# Patient Record
Sex: Female | Born: 1935 | Race: White | Hispanic: No | Smoking: Never smoker
Health system: Southern US, Community
[De-identification: ages and names within clinical notes are randomized; demographics above are authoritative.]

## PROBLEM LIST (undated history)

## (undated) DIAGNOSIS — E7800 Pure hypercholesterolemia, unspecified: Secondary | ICD-10-CM

## (undated) DIAGNOSIS — F0390 Unspecified dementia without behavioral disturbance: Secondary | ICD-10-CM

## (undated) DIAGNOSIS — F419 Anxiety disorder, unspecified: Secondary | ICD-10-CM

## (undated) DIAGNOSIS — G309 Alzheimer's disease, unspecified: Secondary | ICD-10-CM

## (undated) DIAGNOSIS — M810 Age-related osteoporosis without current pathological fracture: Secondary | ICD-10-CM

## (undated) DIAGNOSIS — F0280 Dementia in other diseases classified elsewhere without behavioral disturbance: Secondary | ICD-10-CM

## (undated) HISTORY — DX: Unspecified dementia, unspecified severity, without behavioral disturbance, psychotic disturbance, mood disturbance, and anxiety: F03.90

## (undated) HISTORY — DX: Anxiety disorder, unspecified: F41.9

## (undated) HISTORY — DX: Alzheimer's disease, unspecified: G30.9

## (undated) HISTORY — DX: Pure hypercholesterolemia, unspecified: E78.00

## (undated) HISTORY — DX: Dementia in other diseases classified elsewhere, unspecified severity, without behavioral disturbance, psychotic disturbance, mood disturbance, and anxiety: F02.80

---

## 2005-11-23 ENCOUNTER — Ambulatory Visit: Payer: Self-pay | Admitting: Urology

## 2007-08-21 ENCOUNTER — Ambulatory Visit: Payer: Self-pay

## 2010-04-07 ENCOUNTER — Ambulatory Visit: Payer: Self-pay | Admitting: Endocrinology

## 2011-11-29 ENCOUNTER — Emergency Department: Payer: Self-pay | Admitting: Emergency Medicine

## 2013-08-12 ENCOUNTER — Encounter: Payer: Self-pay | Admitting: Podiatry

## 2013-08-12 ENCOUNTER — Ambulatory Visit (INDEPENDENT_AMBULATORY_CARE_PROVIDER_SITE_OTHER): Payer: Medicare HMO | Admitting: Podiatry

## 2013-08-12 VITALS — BP 113/68 | HR 73 | Resp 16 | Ht 60.0 in | Wt 155.0 lb

## 2013-08-12 DIAGNOSIS — M79609 Pain in unspecified limb: Secondary | ICD-10-CM

## 2013-08-12 DIAGNOSIS — B351 Tinea unguium: Secondary | ICD-10-CM

## 2013-08-12 NOTE — Progress Notes (Signed)
Cynthia Robles presents today with her assistant complaining of painful nails bilateral.  Objective: Vital signs are stable she is alert but not oriented. Her Alzheimer's appears to be getting worse. Nails are painful on palpation as well as debridement. Pulses are intact bilateral.  Assessment: Worsening of her Alzheimer's disease with pain in limb secondary to onychomycosis.  Plan: Debridement of nails in thickness and length as a covered service secondary to pain. Followup with her in 3 months.

## 2013-11-11 ENCOUNTER — Ambulatory Visit: Payer: Self-pay | Admitting: Podiatry

## 2013-11-14 ENCOUNTER — Ambulatory Visit: Payer: Self-pay | Admitting: Podiatry

## 2013-11-25 ENCOUNTER — Ambulatory Visit: Payer: Self-pay | Admitting: Podiatry

## 2014-01-13 ENCOUNTER — Ambulatory Visit (INDEPENDENT_AMBULATORY_CARE_PROVIDER_SITE_OTHER): Payer: Medicare HMO | Admitting: Podiatry

## 2014-01-13 VITALS — BP 129/80 | HR 60 | Resp 20

## 2014-01-13 DIAGNOSIS — B351 Tinea unguium: Secondary | ICD-10-CM

## 2014-01-13 DIAGNOSIS — M79609 Pain in unspecified limb: Secondary | ICD-10-CM

## 2014-01-13 NOTE — Progress Notes (Signed)
She presents today for chief complaint of painful elongated toenails.  Objective: Vital signs are stable she is alert and oriented x3. Nails are thick yellow dystrophic with mycotic and painful palpation as well as debridement.  Assessment: Pain in limb secondary to onychomycosis 1 through 5 bilateral.  Plan: Debridement of nails 1 through 5 bilateral covered service.

## 2014-04-14 ENCOUNTER — Ambulatory Visit (INDEPENDENT_AMBULATORY_CARE_PROVIDER_SITE_OTHER): Payer: Medicare HMO | Admitting: Podiatry

## 2014-04-14 ENCOUNTER — Encounter: Payer: Self-pay | Admitting: Podiatry

## 2014-04-14 VITALS — BP 117/72 | HR 79 | Resp 12

## 2014-04-14 DIAGNOSIS — M79609 Pain in unspecified limb: Secondary | ICD-10-CM

## 2014-04-14 DIAGNOSIS — B351 Tinea unguium: Secondary | ICD-10-CM

## 2014-04-14 DIAGNOSIS — M79676 Pain in unspecified toe(s): Secondary | ICD-10-CM

## 2014-04-14 NOTE — Progress Notes (Signed)
She presents today with her assistant chief complaint of painful E. elongated toenails bilateral.  Objective: Pulses are palpable bilateral. Nails are thick yellow dystrophic onychomycotic and painful palpation.  Assessment: Pain in limb secondary to onychomycosis 1 through 5 bilateral.  Plan: Debridement of nails 1 through 5 bilateral covered service secondary to pain.

## 2014-07-21 ENCOUNTER — Ambulatory Visit (INDEPENDENT_AMBULATORY_CARE_PROVIDER_SITE_OTHER): Payer: Medicare HMO | Admitting: Podiatry

## 2014-07-21 DIAGNOSIS — M79676 Pain in unspecified toe(s): Secondary | ICD-10-CM

## 2014-07-21 DIAGNOSIS — B351 Tinea unguium: Secondary | ICD-10-CM

## 2014-07-21 NOTE — Progress Notes (Signed)
Presents today chief complaint of painful elongated toenails.  Objective: Pulses are palpable bilateral nails are thick, yellow dystrophic onychomycosis and painful palpation.   Assessment: Onychomycosis with pain in limb.  Plan: Treatment of nails in thickness and length as covered service secondary to pain.  

## 2014-10-20 ENCOUNTER — Ambulatory Visit (INDEPENDENT_AMBULATORY_CARE_PROVIDER_SITE_OTHER): Payer: Medicare HMO | Admitting: Podiatry

## 2014-10-20 ENCOUNTER — Ambulatory Visit: Payer: Medicare HMO | Admitting: Podiatry

## 2014-10-20 DIAGNOSIS — M79676 Pain in unspecified toe(s): Secondary | ICD-10-CM

## 2014-10-20 DIAGNOSIS — B351 Tinea unguium: Secondary | ICD-10-CM

## 2014-10-20 NOTE — Progress Notes (Signed)
She presents today with her assistant chief complaint of painful E. elongated toenails bilateral.  Objective: Pulses are palpable bilateral. Nails are thick yellow dystrophic onychomycotic and painful palpation.  Assessment: Pain in limb secondary to onychomycosis 1 through 5 bilateral.  Plan: Debridement of nails 1 through 5 bilateral covered service secondary to pain.

## 2015-01-19 ENCOUNTER — Encounter: Payer: Self-pay | Admitting: Podiatry

## 2015-01-19 ENCOUNTER — Ambulatory Visit (INDEPENDENT_AMBULATORY_CARE_PROVIDER_SITE_OTHER): Payer: Medicare PPO | Admitting: Podiatry

## 2015-01-19 ENCOUNTER — Ambulatory Visit: Payer: Medicare HMO

## 2015-01-19 DIAGNOSIS — M79676 Pain in unspecified toe(s): Secondary | ICD-10-CM | POA: Diagnosis not present

## 2015-01-19 DIAGNOSIS — B351 Tinea unguium: Secondary | ICD-10-CM

## 2015-01-19 NOTE — Progress Notes (Signed)
She presents today with her assistant chief complaint of painful E. elongated toenails bilateral.  Objective: Pulses are palpable bilateral. Nails are thick yellow dystrophic onychomycotic and painful palpation.  Assessment: Pain in limb secondary to onychomycosis 1 through 5 bilateral.  Plan: Debridement of nails 1 through 5 bilateral covered service secondary to pain.

## 2015-05-01 ENCOUNTER — Emergency Department: Payer: Medicare PPO

## 2015-05-01 ENCOUNTER — Encounter: Payer: Self-pay | Admitting: Emergency Medicine

## 2015-05-01 ENCOUNTER — Inpatient Hospital Stay
Admission: EM | Admit: 2015-05-01 | Discharge: 2015-05-05 | DRG: 872 | Disposition: A | Discharge status: Home / Self Care | Payer: Medicare PPO | Attending: Internal Medicine | Admitting: Internal Medicine

## 2015-05-01 DIAGNOSIS — A419 Sepsis, unspecified organism: Secondary | ICD-10-CM | POA: Diagnosis not present

## 2015-05-01 DIAGNOSIS — L8990 Pressure ulcer of unspecified site, unspecified stage: Secondary | ICD-10-CM | POA: Insufficient documentation

## 2015-05-01 DIAGNOSIS — F0280 Dementia in other diseases classified elsewhere without behavioral disturbance: Secondary | ICD-10-CM | POA: Diagnosis present

## 2015-05-01 DIAGNOSIS — Z881 Allergy status to other antibiotic agents status: Secondary | ICD-10-CM

## 2015-05-01 DIAGNOSIS — E860 Dehydration: Secondary | ICD-10-CM | POA: Diagnosis present

## 2015-05-01 DIAGNOSIS — R4182 Altered mental status, unspecified: Secondary | ICD-10-CM

## 2015-05-01 DIAGNOSIS — B961 Klebsiella pneumoniae [K. pneumoniae] as the cause of diseases classified elsewhere: Secondary | ICD-10-CM | POA: Diagnosis present

## 2015-05-01 DIAGNOSIS — Z66 Do not resuscitate: Secondary | ICD-10-CM | POA: Diagnosis present

## 2015-05-01 DIAGNOSIS — F419 Anxiety disorder, unspecified: Secondary | ICD-10-CM | POA: Diagnosis present

## 2015-05-01 DIAGNOSIS — Z7982 Long term (current) use of aspirin: Secondary | ICD-10-CM | POA: Diagnosis not present

## 2015-05-01 DIAGNOSIS — E785 Hyperlipidemia, unspecified: Secondary | ICD-10-CM | POA: Diagnosis present

## 2015-05-01 DIAGNOSIS — R509 Fever, unspecified: Secondary | ICD-10-CM

## 2015-05-01 DIAGNOSIS — Z79899 Other long term (current) drug therapy: Secondary | ICD-10-CM | POA: Diagnosis not present

## 2015-05-01 DIAGNOSIS — N390 Urinary tract infection, site not specified: Secondary | ICD-10-CM

## 2015-05-01 DIAGNOSIS — D649 Anemia, unspecified: Secondary | ICD-10-CM | POA: Diagnosis present

## 2015-05-01 DIAGNOSIS — G309 Alzheimer's disease, unspecified: Secondary | ICD-10-CM | POA: Diagnosis present

## 2015-05-01 HISTORY — DX: Age-related osteoporosis without current pathological fracture: M81.0

## 2015-05-01 LAB — APTT: aPTT: 30 seconds (ref 24–36)

## 2015-05-01 LAB — CBC WITH DIFFERENTIAL/PLATELET
BASOS ABS: 0 10*3/uL (ref 0–0.1)
Basophils Relative: 0 %
EOS PCT: 6 %
Eosinophils Absolute: 0.9 10*3/uL — ABNORMAL HIGH (ref 0–0.7)
HCT: 33.2 % — ABNORMAL LOW (ref 35.0–47.0)
Hemoglobin: 10.7 g/dL — ABNORMAL LOW (ref 12.0–16.0)
Lymphocytes Relative: 7 %
Lymphs Abs: 1.1 10*3/uL (ref 1.0–3.6)
MCH: 27.2 pg (ref 26.0–34.0)
MCHC: 32.2 g/dL (ref 32.0–36.0)
MCV: 84.7 fL (ref 80.0–100.0)
Monocytes Absolute: 0.8 10*3/uL (ref 0.2–0.9)
Monocytes Relative: 5 %
Neutro Abs: 12.5 10*3/uL — ABNORMAL HIGH (ref 1.4–6.5)
Neutrophils Relative %: 82 %
Platelets: 386 10*3/uL (ref 150–440)
RBC: 3.91 MIL/uL (ref 3.80–5.20)
RDW: 16.1 % — ABNORMAL HIGH (ref 11.5–14.5)
WBC: 15.4 10*3/uL — ABNORMAL HIGH (ref 3.6–11.0)

## 2015-05-01 LAB — URINALYSIS COMPLETE WITH MICROSCOPIC (ARMC ONLY)
BILIRUBIN URINE: NEGATIVE
Glucose, UA: NEGATIVE mg/dL
KETONES UR: NEGATIVE mg/dL
Nitrite: POSITIVE — AB
PROTEIN: 30 mg/dL — AB
SPECIFIC GRAVITY, URINE: 1.02 (ref 1.005–1.030)
pH: 7 (ref 5.0–8.0)

## 2015-05-01 LAB — COMPREHENSIVE METABOLIC PANEL
ALBUMIN: 3.6 g/dL (ref 3.5–5.0)
ALT: 17 U/L (ref 14–54)
ANION GAP: 12 (ref 5–15)
AST: 27 U/L (ref 15–41)
Alkaline Phosphatase: 59 U/L (ref 38–126)
BILIRUBIN TOTAL: 0.5 mg/dL (ref 0.3–1.2)
BUN: 26 mg/dL — AB (ref 6–20)
CO2: 25 mmol/L (ref 22–32)
Calcium: 9 mg/dL (ref 8.9–10.3)
Chloride: 97 mmol/L — ABNORMAL LOW (ref 101–111)
Creatinine, Ser: 1.21 mg/dL — ABNORMAL HIGH (ref 0.44–1.00)
GFR calc Af Amer: 47 mL/min — ABNORMAL LOW (ref >60)
GFR calc non Af Amer: 41 mL/min — ABNORMAL LOW (ref >60)
GLUCOSE: 124 mg/dL — AB (ref 65–99)
POTASSIUM: 3.4 mmol/L — AB (ref 3.5–5.1)
Sodium: 134 mmol/L — ABNORMAL LOW (ref 135–145)
Total Protein: 7.1 g/dL (ref 6.5–8.1)

## 2015-05-01 LAB — PROTIME-INR
INR: 0.97
PROTHROMBIN TIME: 13.1 s (ref 11.4–15.0)

## 2015-05-01 LAB — BRAIN NATRIURETIC PEPTIDE: B NATRIURETIC PEPTIDE 5: 123 pg/mL — AB (ref 0.0–100.0)

## 2015-05-01 LAB — LACTIC ACID, PLASMA
Lactic Acid, Venous: 2.7 mmol/L (ref 0.5–2.0)
Lactic Acid, Venous: 1.7 mmol/L (ref 0.5–2.0)

## 2015-05-01 LAB — TROPONIN I: Troponin I: <0.03 ng/mL (ref <0.031)

## 2015-05-01 MED ORDER — PIPERACILLIN-TAZOBACTAM 3.375 G IVPB 30 MIN
3.3750 g | Freq: Once | INTRAVENOUS | Status: AC
  Administered 2015-05-01: 3.375 g via INTRAVENOUS
  Filled 2015-05-01: qty 50

## 2015-05-01 MED ORDER — ONDANSETRON HCL 4 MG/2ML IJ SOLN: 4.0000 mg | Freq: Four times a day (QID) | INTRAMUSCULAR | Status: DC | PRN

## 2015-05-01 MED ORDER — FLUOXETINE HCL 10 MG PO CAPS
10.0000 mg | ORAL_CAPSULE | Freq: Two times a day (BID) | ORAL | Status: DC
  Administered 2015-05-02 – 2015-05-05 (×7): 10 mg via ORAL
  Filled 2015-05-01 (×11): qty 1

## 2015-05-01 MED ORDER — DONEPEZIL HCL 5 MG PO TABS
10.0000 mg | ORAL_TABLET | Freq: Every day | ORAL | Status: DC
  Administered 2015-05-02 – 2015-05-05 (×4): 10 mg via ORAL
  Filled 2015-05-01 (×4): qty 2

## 2015-05-01 MED ORDER — PIPERACILLIN-TAZOBACTAM 3.375 G IVPB
3.3750 g | Freq: Three times a day (TID) | INTRAVENOUS | Status: DC
  Administered 2015-05-02 – 2015-05-05 (×11): 3.375 g via INTRAVENOUS
  Filled 2015-05-01 (×15): qty 50

## 2015-05-01 MED ORDER — ALPRAZOLAM 0.25 MG PO TABS
0.2500 mg | ORAL_TABLET | Freq: Two times a day (BID) | ORAL | Status: DC
  Administered 2015-05-02 – 2015-05-05 (×7): 0.25 mg via ORAL
  Filled 2015-05-01 (×7): qty 1

## 2015-05-01 MED ORDER — VANCOMYCIN HCL IN DEXTROSE 1-5 GM/200ML-% IV SOLN
1000.0000 mg | Freq: Once | INTRAVENOUS | Status: AC
  Administered 2015-05-01: 1000 mg via INTRAVENOUS
  Filled 2015-05-01: qty 200

## 2015-05-01 MED ORDER — ACETAMINOPHEN 500 MG PO TABS
500.0000 mg | ORAL_TABLET | Freq: Two times a day (BID) | ORAL | Status: DC
  Administered 2015-05-02 – 2015-05-05 (×8): 500 mg via ORAL
  Filled 2015-05-01 (×9): qty 1

## 2015-05-01 MED ORDER — ASPIRIN EC 81 MG PO TBEC
81.0000 mg | DELAYED_RELEASE_TABLET | Freq: Every day | ORAL | Status: DC
  Administered 2015-05-02 – 2015-05-05 (×4): 81 mg via ORAL
  Filled 2015-05-01 (×4): qty 1

## 2015-05-01 MED ORDER — SODIUM CHLORIDE 0.9 % IV BOLUS (SEPSIS)
2109.0000 mL | Freq: Once | INTRAVENOUS | Status: AC
  Administered 2015-05-01: 2109 mL via INTRAVENOUS

## 2015-05-01 MED ORDER — CALCIUM CARBONATE-VITAMIN D 500-200 MG-UNIT PO TABS
1.0000 | ORAL_TABLET | Freq: Every day | ORAL | Status: DC
  Administered 2015-05-02 – 2015-05-05 (×4): 1 via ORAL
  Filled 2015-05-01 (×4): qty 1

## 2015-05-01 MED ORDER — SODIUM CHLORIDE 0.9 % IV SOLN
INTRAVENOUS | Status: DC
  Administered 2015-05-01: 1000 mL via INTRAVENOUS
  Administered 2015-05-02 (×2): via INTRAVENOUS

## 2015-05-01 MED ORDER — ACETAMINOPHEN 650 MG RE SUPP
650.0000 mg | Freq: Once | RECTAL | Status: AC
  Administered 2015-05-01: 650 mg via RECTAL
  Filled 2015-05-01: qty 1

## 2015-05-01 NOTE — ED Provider Notes (Signed)
[facility] Emergency Department Provider Note  ____________________________________________  Time seen: Approximately 6:05 PM  I have reviewed the triage vital signs and the nursing notes.   HISTORY  Chief Complaint Altered Mental Status  Caveat-history of present illness and review of systems Limited secondary to the patient's dementia and altered mental status/severity of illness. All history of present illness and review of systems obtained from EMS on arrival.  HPI Cynthia Robles is a 81 y.o. female history of Alzheimer's disease, hyperlipidemia who presents for evaluation of decreased level of consciousness/altered mental status, gradual onset today, constant. Current severity 9 out of 10. She was recently treated for urinary tract infection. Her caregivers called EMS and on their arrival she was noted to be febrile but remaining vital signs within normal limits. No other history available.   Past Medical History  Diagnosis Date  . Anxiety   . Alzheimer disease   . Dementia   . High cholesterol     There are no active problems to display for this patient.   History reviewed. No pertinent past surgical history.  Current Outpatient Rx  Name  Route  Sig  Dispense  Refill  . acetaminophen (TYLENOL) 500 MG tablet   Oral   Take 500 mg by mouth 2 (two) times daily.          Marland Kitchen ALPRAZolam (XANAX) 0.25 MG tablet   Oral   Take 0.25 mg by mouth 2 (two) times daily.         Marland Kitchen aspirin EC 81 MG tablet   Oral   Take 81 mg by mouth daily.         . Calcium Carbonate-Vitamin D (CALCIUM-D) 600-400 MG-UNIT TABS   Oral   Take 1 tablet by mouth daily.         Marland Kitchen denosumab (PROLIA) 60 MG/ML SOLN injection   Subcutaneous   Inject 60 mg into the skin every 6 (six) months. Administer in upper arm, thigh, or abdomen         . diphenhydrAMINE (BENADRYL) 25 mg capsule   Oral   Take 25 mg by mouth 2 (two) times daily.         Marland Kitchen donepezil  (ARICEPT) 10 MG tablet   Oral   Take 10 mg by mouth daily.         Marland Kitchen FLUoxetine (PROZAC) 10 MG capsule   Oral   Take 10 mg by mouth 2 (two) times daily.         . furosemide (LASIX) 20 MG tablet   Oral   Take 20 mg by mouth daily.         . Multiple Vitamins-Minerals (MULTIVITAMIN PO)   Oral   Take 1 tablet by mouth daily.           Allergies Flagyl and Levaquin  History reviewed. No pertinent family history.  Social History History  Substance Use Topics  . Smoking status: Never Smoker   . Smokeless tobacco: Never Used  . Alcohol Use: No    Review of Systems  Constitutional: + fever   Caveat-history of present illness and review of systems Limited secondary to the patient's dementia and altered mental status/severity of illness. All history of present illness and review of systems obtained from EMS on arrival. ____________________________________________   PHYSICAL EXAM:  Filed Vitals:   05/01/15 1809 05/01/15 1817 05/01/15 1900 05/01/15 2000  BP: 123/63  113/63 126/83  Pulse: 81  82 71  Temp: 100.6 F (  38.1 C) 101.3 F (38.5 C) 101.2 F (38.4 C) 100.8 F (38.2 C)  TempSrc: Oral Rectal    Resp: Height:  (1.626 m)     Weight: 130 lb (58.968 kg)     SpO2: 93%  97% 95%      Constitutional: Awake but appears sleepy and confused, answers "what" to all questions, does not follow commands. Appears diaphoretic. Eyes: Conjunctivae are normal. PERRL. EOMI. Head: Atraumatic. Nose: No congestion/rhinnorhea. Mouth/Throat: Mucous membranes are moist.  Oropharynx non-erythematous. Neck: No stridor.   Cardiovascular: Normal rate, regular rhythm. Grossly normal heart sounds.  Good peripheral circulation. Respiratory: + tachypnea No retractions. Lungs CTAB. Gastrointestinal: Soft and nontender. No distention. No abdominal bruits. No CVA tenderness. Genitourinary: deferred Musculoskeletal: No lower extremity tenderness nor edema.  No joint  effusions. Neurologic: Patient appears to move all extremities equally and all extremities to noxious stimuli. GCS 12. Skin:  Skin is warm and intact. No rash noted. Multiple areas of ecchymosis in various stages of healing throughout all extremities. Psychiatric: unable to assess  ____________________________________________   LABS (all labs ordered are listed, but only abnormal results are displayed)  Labs Reviewed  COMPREHENSIVE METABOLIC PANEL - Abnormal; Notable for the following:    Sodium 134 (*)    Potassium 3.4 (*)    Chloride 97 (*)    Glucose, Bld 124 (*)    BUN 26 (*)    Creatinine, Ser 1.21 (*)    GFR calc non Af Amer 41 (*)    GFR calc Af Amer 47 (*)    All other components within normal limits  CBC WITH DIFFERENTIAL/PLATELET - Abnormal; Notable for the following:    WBC 15.4 (*)    Hemoglobin 10.7 (*)    HCT 33.2 (*)    RDW 16.1 (*)    Neutro Abs 12.5 (*)    Eosinophils Absolute 0.9 (*)    All other components within normal limits  LACTIC ACID, PLASMA - Abnormal; Notable for the following:    Lactic Acid, Venous 2.7 (*)    All other components within normal limits  BRAIN NATRIURETIC PEPTIDE - Abnormal; Notable for the following:    B Natriuretic Peptide 123.0 (*)    All other components within normal limits  URINALYSIS COMPLETEWITH MICROSCOPIC ([facility] ONLY) - Abnormal; Notable for the following:    Color, Urine AMBER (*)    APPearance CLOUDY (*)    Hgb urine dipstick 1+ (*)    Protein, ur 30 (*)    Nitrite POSITIVE (*)    Leukocytes, UA 2+ (*)    Bacteria, UA MANY (*)    Squamous Epithelial / LPF 6-30 (*)    All other components within normal limits  CULTURE, BLOOD (ROUTINE X 2)  CULTURE, BLOOD (ROUTINE X 2)  URINE CULTURE  TROPONIN I  APTT  PROTIME-INR  LACTIC ACID, PLASMA   ____________________________________________  EKG  ED ECG REPORT I, Gayla Doss, the attending physician, personally viewed and interpreted this ECG.   Date:  05/01/2015  EKG Time: 18:38  Rate: 82  Rhythm: Suspect normal sinus rhythm  Axis: normal  Intervals:none  ST&T Change: No acute ST segment elevation, nonspecific ST and T-wave abnormality. Suspect normal sinus rhythm however motion artifact limits interpretation.  ____________________________________________  RADIOLOGY  CXR FINDINGS: There is a shallow inspiration. There is mild chronic appearing interstitial coarsening. No confluent airspace opacity is evident. No effusion is evident. Heart size is normal.  IMPRESSION: Chronic appearing interstitial  coarsening. No acute cardiopulmonary findings.   CT head IMPRESSION: No evidence of acute intracranial abnormality.  Atrophy, chronic small-vessel white matter ischemic changes and remote right cerebellar infarct.IMPRESSION: No evidence of acute intracranial abnormality.  Atrophy, chronic small-vessel white matter ischemic changes and remote right cerebellar infarct. ____________________________________________   PROCEDURES  Procedure(s) performed: None  Critical Care performed: Yes, see critical care note(s). Total critical care time spent 35 minutes.  ____________________________________________   INITIAL IMPRESSION / ASSESSMENT AND PLAN / ED COURSE  Pertinent labs & imaging results that were available during my care of the patient were reviewed by me and considered in my medical decision making (see chart for details).  Cynthia Robles is a 81 y.o. female history of Alzheimer's disease, hyperlipidemia who presents for evaluation of decreased level of consciousness/altered mental status today. On exam she appears to be altered but is moving all extremities equally though she is not able to cooperate formally with full neurological testing. She is febrile with temperature 101.3, tachypnea meeting 2 out of 4 Sirs criteria. Plan for labs, cultures, 30 mL/kg bolus of normal saline as well as Vancomycin and Zosyn  empirically. We will obtain labs, chest x-ray, urinalysis, CT head and anticipate admission.  ----------------------------------------- 8:24 PM on 05/01/2015 ----------------------------------------- Labs notable for leukocytosis of 15,000, anemia hemoglobin 10.7. Creatinine mildly elevated at 1.27. Elevated lactic acid. Imaging negative for any acute cardiopulmonary or intracranial abnormality. Urinalysis consistent with nitrite positive UTI. Discussed with hospitalist for admission for treatment of UTI sepsis. Vitals stable.   ____________________________________________   FINAL CLINICAL IMPRESSION(S) / ED DIAGNOSES  Final diagnoses:  Altered mental status, unspecified altered mental status type  Fever and chills  UTI (lower urinary tract infection)  Sepsis, due to unspecified organism      Gayla Doss, MD 05/01/15 2026

## 2015-05-01 NOTE — Plan of Care (Signed)
Problem: Discharge Progression Outcomes Goal: Discharge plan in place and appropriate Individualization Pt admitted from ED with UTI not responding to home antibiotics Hx of Dementia controlled by home meds 24 hour care givers in the home

## 2015-05-01 NOTE — ED Notes (Signed)
Pt via ems from home with altered mental status, emesis, fever. Pt just finished round of antibiotics for uti

## 2015-05-01 NOTE — Progress Notes (Signed)
ANTIBIOTIC CONSULT NOTE - INITIAL  Pharmacy Consult for Zosyn Indication: Sepsis  Allergies  Allergen Reactions  . Flagyl [Metronidazole] Other (See Comments)    Reaction:  Unknown   . Levaquin [Levofloxacin In D5w] Hives    Patient Measurements: Height:  (162.6 cm) Weight: 130 lb (58.968 kg) IBW/kg (Calculated) : 54.7  Vital Signs: Temp: 100.9 F (38.3 C) (07/29 2125) Temp Source: Other (Comment) (07/29 2101) BP: 125/65 mmHg (07/29 2125) Pulse Rate: 73 (07/29 2125) Intake/Output from previous day:   Intake/Output from this shift: Total I/O In: -  Out: 175 [Urine:175]  Labs:  Recent Labs  05/01/15 1820  WBC 15.4*  HGB 10.7*  PLT 386  CREATININE 1.21*   Estimated Creatinine Clearance: 31.5 mL/min (by C-G formula based on Cr of 1.21). No results for input(s): VANCOTROUGH, VANCOPEAK, VANCORANDOM, GENTTROUGH, GENTPEAK, GENTRANDOM, TOBRATROUGH, TOBRAPEAK, TOBRARND, AMIKACINPEAK, AMIKACINTROU, AMIKACIN in the last 72 hours.   Microbiology: No results found for this or any previous visit (from the past 720 hour(s)).  Medical History: Past Medical History  Diagnosis Date  . Anxiety   . Alzheimer disease   . Dementia   . High cholesterol     Medications:   (Not in a hospital admission) Scheduled:   Infusions:  . sodium chloride 1,000 mL (05/01/15 2126)  . [START ON 05/02/2015] piperacillin-tazobactam (ZOSYN)  IV     PRN:   Assessment: 81 y/o F failing outpatient abx for UTI admitted with sepsis.   Goal  of Therapy:  Resolution of infection.   Plan:  Zosyn 3.375 g iv once given in ED. Will order Zosyn 3.375 g EI q 8 h beginning 6 hours after initial dose.   Luisa Hart D 05/01/2015,9:49 PM

## 2015-05-01 NOTE — H&P (Signed)
Cynthia Robles is an 81 y.o. female.   Chief Complaint: Decrease in alertness HPI: Pt diagnosed with UTI 2 weeks ago and treated with abx. Symptoms improved. Today had decreased alertness and vomited a couple of times. Found to still have UTI in ED.  Past Medical History  Diagnosis Date  . Anxiety   . Alzheimer disease   . Dementia   . High cholesterol     History reviewed. No pertinent past surgical history.  Thyroid nodule removed  History reviewed. No pertinent family history.  FH of CAD Social History:  reports that she has never smoked. She has never used smokeless tobacco. She reports that she does not drink alcohol or use illicit drugs.  Allergies:  Allergies  Allergen Reactions  . Flagyl [Metronidazole] Other (See Comments)    Reaction:  Unknown   . Levaquin [Levofloxacin In D5w] Hives     (Not in a hospital admission)  Results for orders placed or performed during the hospital encounter of 05/01/15 (from the past 48 hour(s))  Comprehensive metabolic panel     Status: Abnormal   Collection Time: 05/01/15  6:20 PM  Result Value Ref Range   Sodium 134 (L) 135 - 145 mmol/L   Potassium 3.4 (L) 3.5 - 5.1 mmol/L   Chloride 97 (L) 101 - 111 mmol/L   CO2 25 22 - 32 mmol/L   Glucose, Bld 124 (H) 65 - 99 mg/dL   BUN 26 (H) 6 - 20 mg/dL   Creatinine, Ser 1.21 (H) 0.44 - 1.00 mg/dL   Calcium 9.0 8.9 - 10.3 mg/dL   Total Protein 7.1 6.5 - 8.1 g/dL   Albumin 3.6 3.5 - 5.0 g/dL   AST 27 15 - 41 U/L   ALT 17 14 - 54 U/L   Alkaline Phosphatase 59 38 - 126 U/L   Total Bilirubin 0.5 0.3 - 1.2 mg/dL   GFR calc non Af Amer 41 (L) >60 mL/min   GFR calc Af Amer 47 (L) >60 mL/min    Comment: (NOTE) The eGFR has been calculated using the CKD EPI equation. This calculation has not been validated in all clinical situations. eGFR's persistently <60 mL/min signify possible Chronic Kidney Disease.    Anion gap 12 5 - 15  CBC WITH DIFFERENTIAL     Status: Abnormal   Collection  Time: 05/01/15  6:20 PM  Result Value Ref Range   WBC 15.4 (H) 3.6 - 11.0 K/uL   RBC 3.91 3.80 - 5.20 MIL/uL   Hemoglobin 10.7 (L) 12.0 - 16.0 g/dL   HCT 33.2 (L) 35.0 - 47.0 %   MCV 84.7 80.0 - 100.0 fL   MCH 27.2 26.0 - 34.0 pg   MCHC 32.2 32.0 - 36.0 g/dL   RDW 16.1 (H) 11.5 - 14.5 %   Platelets 386 150 - 440 K/uL   Neutrophils Relative % 82 %   Neutro Abs 12.5 (H) 1.4 - 6.5 K/uL   Lymphocytes Relative 7 %   Lymphs Abs 1.1 1.0 - 3.6 K/uL   Monocytes Relative 5 %   Monocytes Absolute 0.8 0.2 - 0.9 K/uL   Eosinophils Relative 6 %   Eosinophils Absolute 0.9 (H) 0 - 0.7 K/uL   Basophils Relative 0 %   Basophils Absolute 0.0 0 - 0.1 K/uL  Lactic acid, plasma     Status: Abnormal   Collection Time: 05/01/15  6:20 PM  Result Value Ref Range   Lactic Acid, Venous 2.7 (HH) 0.5 - 2.0 mmol/L  Comment: RESULTS VERIFIED BY REPEAT TESTING CRITICAL RESULT CALLED TO, READ BACK BY AND VERIFIED WITH BUTCH WOODS AT 1914 05/01/15.PMH   Brain natriuretic peptide     Status: Abnormal   Collection Time: 05/01/15  6:20 PM  Result Value Ref Range   B Natriuretic Peptide 123.0 (H) 0.0 - 100.0 pg/mL  Troponin I     Status: None   Collection Time: 05/01/15  6:20 PM  Result Value Ref Range   Troponin I <0.03 <0.031 ng/mL    Comment:        NO INDICATION OF MYOCARDIAL INJURY.   APTT     Status: None   Collection Time: 05/01/15  6:20 PM  Result Value Ref Range   aPTT 30 24 - 36 seconds  Protime-INR     Status: None   Collection Time: 05/01/15  6:20 PM  Result Value Ref Range   Prothrombin Time 13.1 11.4 - 15.0 seconds   INR 0.97   Urinalysis complete, with microscopic ([facility] only)     Status: Abnormal   Collection Time: 05/01/15  6:20 PM  Result Value Ref Range   Color, Urine AMBER (A) YELLOW   APPearance CLOUDY (A) CLEAR   Glucose, UA NEGATIVE NEGATIVE mg/dL   Bilirubin Urine NEGATIVE NEGATIVE   Ketones, ur NEGATIVE NEGATIVE mg/dL   Specific Gravity, Urine 1.020 1.005 - 1.030   Hgb  urine dipstick 1+ (A) NEGATIVE   pH 7.0 5.0 - 8.0   Protein, ur 30 (A) NEGATIVE mg/dL   Nitrite POSITIVE (A) NEGATIVE   Leukocytes, UA 2+ (A) NEGATIVE   RBC / HPF 6-30 0 - 5 RBC/hpf   WBC, UA TOO NUMEROUS TO COUNT 0 - 5 WBC/hpf   Bacteria, UA MANY (A) NONE SEEN   Squamous Epithelial / LPF 6-30 (A) NONE SEEN   Mucous PRESENT    Ct Head Wo Contrast  05/01/2015   CLINICAL DATA:  81 year old female with altered mental status and fever.  EXAM: CT HEAD WITHOUT CONTRAST  TECHNIQUE: Contiguous axial images were obtained from the base of the skull through the vertex without intravenous contrast.  COMPARISON:  11/29/2011 CT  FINDINGS: Moderate atrophy and chronic small-vessel white matter ischemic changes again noted.  A remote right cerebellar infarct is present.  No acute intracranial abnormalities are identified, including mass lesion or mass effect, hydrocephalus, extra-axial fluid collection, midline shift, hemorrhage, or acute infarction.  The visualized bony calvarium is unremarkable.  IMPRESSION: No evidence of acute intracranial abnormality.  Atrophy, chronic small-vessel white matter ischemic changes and remote right cerebellar infarct.   Electronically Signed   By: Margarette Canada M.D.   On: 05/01/2015 19:46   Dg Chest Port 1 View  05/01/2015   CLINICAL DATA:  Altered mental status.  Emesis.  Fever  EXAM: PORTABLE CHEST - 1 VIEW  COMPARISON:  None.  FINDINGS: There is a shallow inspiration. There is mild chronic appearing interstitial coarsening. No confluent airspace opacity is evident. No effusion is evident. Heart size is normal.  IMPRESSION: Chronic appearing interstitial coarsening. No acute cardiopulmonary findings.   Electronically Signed   By: Andreas Newport M.D.   On: 05/01/2015 19:03    Review of Systems  Constitutional: Positive for fever.  HENT: Negative for hearing loss.   Eyes: Negative for blurred vision.  Respiratory: Negative for cough and shortness of breath.    Cardiovascular: Positive for chest pain.  Gastrointestinal: Positive for nausea and vomiting.  Genitourinary: Positive for dysuria.  Musculoskeletal: Negative for back pain.  Skin: Negative for rash.  Neurological: Negative for tremors.    Blood pressure 90/71, pulse 73, temperature 100.8 F (38.2 C), temperature source Other (Comment), resp. rate 19, height 5' 4"  (1.626 m), weight 58.968 kg (130 lb), SpO2 97 %. Physical Exam  Constitutional:  Ill appearing female   HENT:  Head: Normocephalic and atraumatic.  Mouth/Throat: No oropharyngeal exudate.  Dry oropharnxy  Eyes: Pupils are equal, round, and reactive to light. Left eye exhibits no discharge. No scleral icterus.  Neck: No JVD present. No thyromegaly present.  Cardiovascular: Normal rate.   Murmur heard. Respiratory: No respiratory distress. She has no wheezes. She exhibits no tenderness.  GI: Bowel sounds are normal. She exhibits no distension and no mass.  Musculoskeletal: She exhibits no edema.  Neurological:  Obtunded  Skin: Skin is warm.  Multiple echymosis onupper and lower ext     Assessment/Plan 1. Sepsis: Pt is tachycardic, high WBC and febrile. Will give supportive care and treat underlying cause.   2. UTI: Given Vanc and Zosyn in ED. Will continue Zosyn. Get urine culture. Suspect UTI was resistant to abx given as outpt.  3. N/V: Antiemetics PRN  4. Dehydration: IVF  5. Code Status: Discussed with daughter. She wishes her to be full code.  Time spent= 34mn  JBaxter Hire7/29/2016, 9:05 PM

## 2015-05-02 LAB — CBC
HCT: 28.4 % — ABNORMAL LOW (ref 35.0–47.0)
HEMOGLOBIN: 9.1 g/dL — AB (ref 12.0–16.0)
MCH: 27.1 pg (ref 26.0–34.0)
MCHC: 32 g/dL (ref 32.0–36.0)
MCV: 84.8 fL (ref 80.0–100.0)
Platelets: 289 10*3/uL (ref 150–440)
RBC: 3.34 MIL/uL — AB (ref 3.80–5.20)
RDW: 16 % — ABNORMAL HIGH (ref 11.5–14.5)
WBC: 14.3 10*3/uL — ABNORMAL HIGH (ref 3.6–11.0)

## 2015-05-02 LAB — BASIC METABOLIC PANEL
Anion gap: 6 (ref 5–15)
BUN: 21 mg/dL — AB (ref 6–20)
CO2: 23 mmol/L (ref 22–32)
Calcium: 7.7 mg/dL — ABNORMAL LOW (ref 8.9–10.3)
Chloride: 108 mmol/L (ref 101–111)
Creatinine, Ser: 1.02 mg/dL — ABNORMAL HIGH (ref 0.44–1.00)
GFR calc non Af Amer: 50 mL/min — ABNORMAL LOW (ref >60)
GFR, EST AFRICAN AMERICAN: 58 mL/min — AB (ref >60)
GLUCOSE: 110 mg/dL — AB (ref 65–99)
Potassium: 3.5 mmol/L (ref 3.5–5.1)
Sodium: 137 mmol/L (ref 135–145)

## 2015-05-02 NOTE — Progress Notes (Signed)
Patient shivering while taking b/p  Will recheck. patient has dementia and is frustrated with auto b/p check

## 2015-05-02 NOTE — Progress Notes (Signed)
Sitter states she doesn't need her feet elevated because she moves her feet a lot during the night

## 2015-05-02 NOTE — Progress Notes (Signed)
[facility] Phys[facility] - [place] at [facility]   PATIENT NAME: Cynthia Robles    MR#:  [id number]  DATE OF BIRTH:  [birth year]  SUBJECTIVE:  Patient is soundly sleeping. Family is at bedside who provides information. No acute events overnight.  REVIEW OF SYSTEMS:    Review of Systems  Unable to perform ROS: dementia    Tolerating Diet: Yes      DRUG ALLERGIES:   Allergies  Allergen Reactions  . Flagyl [Metronidazole] Other (See Comments)    Reaction:  Unknown   . Levaquin [Levofloxacin In D5w] Hives    VITALS:  Blood pressure 197/161, pulse 68, temperature 97.7 F (36.5 C), temperature source Oral, resp. rate 22, height 5\' 2"  (1.575 m), weight 56.065 kg (123 lb 9.6 oz), SpO2 96 %.  PHYSICAL EXAMINATION:   Physical Exam  Constitutional: She is well-developed, well-nourished, and in no distress. No distress.  HENT:  Head: Normocephalic.  Eyes: No scleral icterus.  Neck: Normal range of motion. No JVD present. No tracheal deviation present.  Cardiovascular: Normal rate, regular rhythm and normal heart sounds.  Exam reveals no gallop and no friction rub.   No murmur heard. Pulmonary/Chest: Effort normal and breath sounds normal. No respiratory distress. She has no wheezes. She has no rales. She exhibits no tenderness.  Abdominal: Soft. Bowel sounds are normal. She exhibits no distension and no mass. There is no tenderness. There is no rebound and no guarding.  Musculoskeletal: Normal range of motion. She exhibits no edema.  Neurological:  sleeping  Skin: Skin is warm. No rash noted. No erythema.      LABORATORY PANEL:   CBC  Recent Labs Lab 05/02/15 0449  WBC 14.3*  HGB 9.1*  HCT 28.4*  PLT 289   ------------------------------------------------------------------------------------------------------------------  Chemistries   Recent Labs Lab 05/01/15 1820 05/02/15 0449  NA 134* 137  K 3.4* 3.5  CL 97* 108  CO2 25 23  GLUCOSE 124*  110*  BUN 26* 21*  CREATININE 1.21* 1.02*  CALCIUM 9.0 7.7*  AST 27  --   ALT 17  --   ALKPHOS 59  --   BILITOT 0.5  --    ------------------------------------------------------------------------------------------------------------------  Cardiac Enzymes  Recent Labs Lab 05/01/15 1820  TROPONINI <0.03   ------------------------------------------------------------------------------------------------------------------  RADIOLOGY:  Ct Head Wo Contrast  05/01/2015   CLINICAL DATA:  81 year old female with altered mental status and fever.  EXAM: CT HEAD WITHOUT CONTRAST  TECHNIQUE: Contiguous axial images were obtained from the base of the skull through the vertex without intravenous contrast.  COMPARISON:  11/29/2011 CT  FINDINGS: Moderate atrophy and chronic small-vessel white matter ischemic changes again noted.  A remote right cerebellar infarct is present.  No acute intracranial abnormalities are identified, including mass lesion or mass effect, hydrocephalus, extra-axial fluid collection, midline shift, hemorrhage, or acute infarction.  The visualized bony calvarium is unremarkable.  IMPRESSION: No evidence of acute intracranial abnormality.  Atrophy, chronic small-vessel white matter ischemic changes and remote right cerebellar infarct.   Electronically Signed   By: Harmon Pier M.D.   On: 05/01/2015 19:46   Dg Chest Port 1 View  05/01/2015   CLINICAL DATA:  Altered mental status.  Emesis.  Fever  EXAM: PORTABLE CHEST - 1 VIEW  COMPARISON:  None.  FINDINGS: There is a shallow inspiration. There is mild chronic appearing interstitial coarsening. No confluent airspace opacity is evident. No effusion is evident. Heart size is normal.  IMPRESSION: Chronic appearing interstitial coarsening. No acute cardiopulmonary  findings.   Electronically Signed   By: Ellery Plunk M.D.   On: 05/01/2015 19:03     ASSESSMENT AND PLAN:   81 year old female with dementia who was treated as an  outpatient for urinary tract infection presented with decreased alertness and vomiting. She is found to have  1. Sepsis: Patient presented with tachycardia, leukocytosis and fever. Sepsis secondary to urinary tract infection. Sepsis improving 2. Urinary tract infection: Blood and urine cultures are pending at this time. Patient will continue on Zosyn. Patient failed outpatient therapy.  3. Nausea/vomiting: This is likely secondary to problem #2. She seems to have tolerated her diet. We'll continue supportive care  4. Dehydration: Continue IV fluids. Her creatinine has improved.  5. Dementia: Patient will continue her outpatient medications which include Aricept and Prozac.      Management plans discussed with the patient's family and they are in agreement.  CODE STATUS: DNR  TOTAL TIME TAKING CARE OF THIS PATIENT: 35 minutes.     POSSIBLE D/C 1-2 days , DEPENDING ON CLINICAL CONDITION.   Cimberly Stoffel M.D on 05/02/2015 at 11:16 AM  Between 7am to 6pm - Pager - [phone] After 6pm go to [email or web address] - password [email or web address] [facility]  [facility]  Office  [phone]  CC: Primary care physician; No PCP Per Patient

## 2015-05-02 NOTE — Plan of Care (Signed)
Problem: Phase I Progression Outcomes Goal: Voiding-avoid urinary catheter unless indicated Outcome: Not Applicable Date Met:  04/88/89 Patient has dementia family member and family sitter at bedside

## 2015-05-02 NOTE — Care Management Note (Signed)
Case Management Note  Patient Details  Name: Cynthia Robles MRN: [id number] Date of Birth: [birth year]  Subjective/Objective:    Discussed discharge planning with 81yo Cynthia Robles daughter Cynthia Robles ph: [phone]. Daughter Cynthia Robles wants to be called when Cynthia Gasparro is discharged home. The following information was provided by daughter Cynthia Robles:  Cynthia Zuccaro has a 24/7 live-in caregiver named Cynthia Robles. PCP= Dr Marylouise Stacks, Pharmacy= [facility] on [address] in [place]. Home equipment includes a "transporter chair", a shower chair, a rolling walker, a bedside commode. Discharge plan per daughter is for Cynthia Borgwardt to return home into the care of her 24/7 caregiver and Cynthia Tolle's family members who will assist with her care.                 Action/Plan:   Expected Discharge Date:                  Expected Discharge Plan:     In-House Referral:     Discharge planning Services     Post Acute Care Choice:    Choice offered to:     DME Arranged:    DME Agency:     HH Arranged:    HH Agency:     Status of Service:     Medicare Important Message Given:    Date Medicare IM Given:    Medicare IM give by:    Date Additional Medicare IM Given:    Additional Medicare Important Message give by:     If discussed at Long Length of Stay Meetings, dates discussed:    Additional Comments:  Shariyah Eland A, RN 05/02/2015, 3:07 PM

## 2015-05-03 LAB — URINE CULTURE: Culture: >=100000

## 2015-05-03 LAB — CBC
HCT: 29.5 % — ABNORMAL LOW (ref 35.0–47.0)
HEMOGLOBIN: 9.4 g/dL — AB (ref 12.0–16.0)
MCH: 27.3 pg (ref 26.0–34.0)
MCHC: 31.8 g/dL — ABNORMAL LOW (ref 32.0–36.0)
MCV: 85.9 fL (ref 80.0–100.0)
Platelets: 290 10*3/uL (ref 150–440)
RBC: 3.43 MIL/uL — AB (ref 3.80–5.20)
RDW: 16.5 % — AB (ref 11.5–14.5)
WBC: 13.4 10*3/uL — AB (ref 3.6–11.0)

## 2015-05-03 MED ORDER — VANCOMYCIN HCL 500 MG IV SOLR
500.0000 mg | INTRAVENOUS | Status: DC
  Filled 2015-05-03: qty 500

## 2015-05-03 MED ORDER — VANCOMYCIN HCL IN DEXTROSE 750-5 MG/150ML-% IV SOLN
750.0000 mg | Freq: Once | INTRAVENOUS | Status: AC
Start: 2015-05-03 — End: 2015-05-03
  Administered 2015-05-03: 750 mg via INTRAVENOUS
  Filled 2015-05-03: qty 150

## 2015-05-03 MED ORDER — VANCOMYCIN HCL IN DEXTROSE 750-5 MG/150ML-% IV SOLN
750.0000 mg | INTRAVENOUS | Status: DC
  Filled 2015-05-03 (×2): qty 150

## 2015-05-03 NOTE — Plan of Care (Signed)
Problem: Discharge Progression Outcomes Goal: Other Discharge Outcomes/Goals Outcome: Progressing Plan of care progress to goal for:  1. Pain:          Denies pain. 2. Hemodynamically Stable:         Remains on IVF's.         Remains on IV Antibiotics.         Foley Catheter intact with yellow urine output. 3. Complications:         Low Grade Fever.         Remains on IV Antibiotics. 4. Diet:         Regular Diet.         Tolerating Well.         Appetite Increasing. 5. Activity:         Bedrest.

## 2015-05-03 NOTE — Progress Notes (Signed)
ANTIBIOTIC CONSULT NOTE - INITIAL  Pharmacy Consult for Vancomycin Indication: rule out sepsis  Allergies  Allergen Reactions  . Flagyl [Metronidazole] Other (See Comments)    Reaction:  Unknown   . Levaquin [Levofloxacin In D5w] Hives    Patient Measurements: Height: 5\' 2"  (157.5 cm) Weight: 123 lb 9.6 oz (56.065 kg) IBW/kg (Calculated) : 50.1   Vital Signs: Temp: 98.4 F (36.9 C) (07/31 0520) Temp Source: Oral (07/31 0520) BP: 129/72 mmHg (07/31 0520) Pulse Rate: 62 (07/31 0520) Intake/Output from previous day: 07/30 0701 - 07/31 0700 In: 1876.3 [P.O.:240; I.V.:1200; IV Piggyback:86.3] Out: 300 [Urine:300] Intake/Output from this shift: Total I/O In: 120 [P.O.:120] Out: -   Labs:  Recent Labs  05/01/15 1820 05/02/15 0449 05/03/15 0436  WBC 15.4* 14.3* 13.4*  HGB 10.7* 9.1* 9.4*  PLT 386 289 290  CREATININE 1.21* 1.02*  --    Estimated Creatinine Clearance: 34.2 mL/min (by C-G formula based on Cr of 1.02). No results for input(s): VANCOTROUGH, VANCOPEAK, VANCORANDOM, GENTTROUGH, GENTPEAK, GENTRANDOM, TOBRATROUGH, TOBRAPEAK, TOBRARND, AMIKACINPEAK, AMIKACINTROU, AMIKACIN in the last 72 hours.   Microbiology: Recent Results (from the past 720 hour(s))  Blood Culture (routine x 2)     Status: None (Preliminary result)   Collection Time: 05/01/15  6:20 PM  Result Value Ref Range Status   Specimen Description BLOOD LEFT FATTY CASTS  Final   Special Requests BOTTLES DRAWN AEROBIC AND ANAEROBIC  1CC  Final   Culture  Setup Time   Final    GRAM POSITIVE COCCI IN CHAINS IN BOTH AEROBIC AND ANAEROBIC BOTTLES IDENTIFICATION TO FOLLOW CRITICAL RESULT CALLED TO, READ BACK BY AND VERIFIED WITH: CHRISTA YOUNG ON 05/02/15 AT 1210PM BY JEF CRITICAL VALUE NOTED.  VALUE IS CONSISTENT WITH PREVIOUSLY REPORTED AND CALLED VALUE.    Culture   Final    GRAM POSITIVE COCCI IN CHAINS IDENTIFICATION TO FOLLOW IN BOTH AEROBIC AND ANAEROBIC BOTTLES    Report Status PENDING   Incomplete  Blood Culture (routine x 2)     Status: None (Preliminary result)   Collection Time: 05/01/15  6:20 PM  Result Value Ref Range Status   Specimen Description BLOOD LEFT WRIST  Final   Special Requests BOTTLES DRAWN AEROBIC AND ANAEROBIC  1CC  Final   Culture  Setup Time   Final    GRAM POSITIVE COCCI IN CLUSTERS IN BOTH AEROBIC AND ANAEROBIC BOTTLES IDENTIFICATION TO FOLLOW CRITICAL RESULT CALLED TO, READ BACK BY AND VERIFIED WITH: CHRISTA YOUNG ON 05/02/15 AT 1650 BY JEF    Culture   Final    GRAM POSITIVE COCCI IN CLUSTERS IN BOTH AEROBIC AND ANAEROBIC BOTTLES IDENTIFICATION TO FOLLOW    Report Status PENDING  Incomplete  Urine culture     Status: None   Collection Time: 05/01/15  6:20 PM  Result Value Ref Range Status   Specimen Description URINE, RANDOM  Final   Special Requests NONE  Final   Culture >=100,000 COLONIES/mL KLEBSIELLA OXYTOCA  Final   Report Status 05/03/2015 FINAL  Final   Organism ID, Bacteria KLEBSIELLA OXYTOCA  Final      Susceptibility   Klebsiella oxytoca - MIC*    AMPICILLIN >=32 RESISTANT Resistant     CEFTAZIDIME <=1 SENSITIVE Sensitive     CEFAZOLIN 8 SENSITIVE Sensitive     CEFTRIAXONE <=1 SENSITIVE Sensitive     CIPROFLOXACIN <=0.25 SENSITIVE Sensitive     GENTAMICIN <=1 SENSITIVE Sensitive     IMIPENEM <=0.25 SENSITIVE Sensitive     TRIMETH/SULFA <=  20 SENSITIVE Sensitive     NITROFURANTOIN Value in next row Resistant      RESISTANT256    PIP/TAZO Value in next row Sensitive      SENSITIVE<=4    * >=100,000 COLONIES/mL KLEBSIELLA OXYTOCA    Medical History: Past Medical History  Diagnosis Date  . Anxiety   . Alzheimer disease   . Dementia   . High cholesterol   . Alzheimer disease   . Osteoporosis      Assessment: This 81 year old female patient has been ordered Vancomycin in addition to Zosyn for treatment of sepsis  CrCl ~ 38.31, ke = 0.036, Vd = 39.27, T 1/2 = 19.25  Goal of Therapy:  Vancomycin trough level  15-20 mcg/ml  Plan:  Ordered Vancomycin  IV once at 11:00 today, followed by Vancomycin  IV Q24hr starting at 17:00 today (stacked dose).  Ordered first trough for 8/2 at 16:30 prior to 4th dose.  Pharmacy will continue to follow.  [name],[name] 05/03/2015,11:19 AM

## 2015-05-03 NOTE — Plan of Care (Signed)
Problem: Discharge Progression Outcomes Goal: Other Discharge Outcomes/Goals Outcome: Progressing Plan of care progress to goal for:  1. Pain:          Denies pain. 2. Hemodynamically Stable:         Remains on IVF's.         Remains on IV Antibiotics.         Foley Catheter intact with yellow urine outut. 3. Complications:         Low Grade Fever.         Resolved without Medication after removing blankets. 4. Diet:         Regular Diet.         Tolerating Well.         Decreased Appetite. 5. Activity:         Bedrest.

## 2015-05-03 NOTE — Plan of Care (Signed)
Problem: Discharge Progression Outcomes Goal: Other Discharge Outcomes/Goals Outcome: Progressing Plan of care progress to goal: Discharge plan- pt will discharge to home with 24 hour care givers. Private sitter at bedside currently.  Pain- no c/o pain, no signs or symptoms of pain noted this shift. Hemodynamically stable- BUN, creatinine, and WBC's improving, continue to monitor labs. Complications- low grade temp 100.6 axillary, improved with removing blankets and cooling room, scheduled tylenol given per order.  Diet- tolerating, no c/o nausea or vomiting.  Discharge outcomes- foley cath continues to drain yellow urine.

## 2015-05-03 NOTE — Progress Notes (Addendum)
[facility] Physicians - [place] at [facility]   PATIENT NAME: Cynthia Robles    MR#:  [id number]  DATE OF BIRTH:  03-Sep-1931  SUBJECTIVE:  Blood cx 2/2 +GPC No acute events overnight  REVIEW OF SYSTEMS:    Review of Systems  Unable to perform ROS: dementia    Tolerating Diet: Yes      DRUG ALLERGIES:   Allergies  Allergen Reactions  . Flagyl [Metronidazole] Other (See Comments)    Reaction:  Unknown   . Levaquin [Levofloxacin In D5w] Hives    VITALS:  Blood pressure 129/72, pulse 62, temperature 98.4 F (36.9 C), temperature source Oral, resp. rate 22, height  (1.575 m), weight 56.065 kg (123 lb 9.6 oz), SpO2 98 %.  PHYSICAL EXAMINATION:   Physical Exam  Constitutional: She is well-developed, well-nourished, and in no distress. No distress.  HENT:  Head: Normocephalic.  Eyes: No scleral icterus.  Neck: Normal range of motion. No JVD present. No tracheal deviation present.  Cardiovascular: Normal rate, regular rhythm and normal heart sounds.  Exam reveals no gallop and no friction rub.   No murmur heard. Pulmonary/Chest: Effort normal and breath sounds normal. No respiratory distress. She has no wheezes. She has no rales. She exhibits no tenderness.  Abdominal: Soft. Bowel sounds are normal. She exhibits no distension and no mass. There is no tenderness. There is no rebound and no guarding.  Musculoskeletal: Normal range of motion. She exhibits no edema.  Neurological: She is alert. No cranial nerve deficit.  Skin: Skin is warm. No rash noted. No erythema.  Psychiatric: Affect normal.      LABORATORY PANEL:   CBC  Recent Labs Lab 05/03/15 0436  WBC 13.4*  HGB 9.4*  HCT 29.5*  PLT 290   ------------------------------------------------------------------------------------------------------------------  Chemistries   Recent Labs Lab 05/01/15 1820 05/02/15 0449  NA 134* 137  K 3.4* 3.5  CL 97* 108  CO2 25 23  GLUCOSE 124*  110*  BUN 26* 21*  CREATININE 1.21* 1.02*  CALCIUM 9.0 7.7*  AST 27  --   ALT 17  --   ALKPHOS 59  --   BILITOT 0.5  --    ------------------------------------------------------------------------------------------------------------------  Cardiac Enzymes  Recent Labs Lab 05/01/15 1820  TROPONINI <0.03   ------------------------------------------------------------------------------------------------------------------  RADIOLOGY:  Ct Head Wo Contrast  05/01/2015   CLINICAL DATA:  81 year old female with altered mental status and fever.  EXAM: CT HEAD WITHOUT CONTRAST  TECHNIQUE: Contiguous axial images were obtained from the base of the skull through the vertex without intravenous contrast.  COMPARISON:  11/29/2011 CT  FINDINGS: Moderate atrophy and chronic small-vessel white matter ischemic changes again noted.  A remote right cerebellar infarct is present.  No acute intracranial abnormalities are identified, including mass lesion or mass effect, hydrocephalus, extra-axial fluid collection, midline shift, hemorrhage, or acute infarction.  The visualized bony calvarium is unremarkable.  IMPRESSION: No evidence of acute intracranial abnormality.  Atrophy, chronic small-vessel white matter ischemic changes and remote right cerebellar infarct.   Electronically Signed   By: Harmon Pier M.D.   On: 05/01/2015 19:46   Dg Chest Port 1 View  05/01/2015   CLINICAL DATA:  Altered mental status.  Emesis.  Fever  EXAM: PORTABLE CHEST - 1 VIEW  COMPARISON:  None.  FINDINGS: There is a shallow inspiration. There is mild chronic appearing interstitial coarsening. No confluent airspace opacity is evident. No effusion is evident. Heart size is normal.  IMPRESSION: Chronic appearing interstitial coarsening. No  acute cardiopulmonary findings.   Electronically Signed   By: Ellery Plunk M.D.   On: 05/01/2015 19:03     ASSESSMENT AND PLAN:   81 year old female with dementia who was treated as an  outpatient for urinary tract infection presented with decreased alertness and vomiting. She is found to have  1. Sepsis: Patient presented with tachycardia, leukocytosis and fever. Sepsis secondary to urinary tract infection and now with 2/2 GPC bacteremia.  Continue VANC (pharamacy dosing)  2. Klebsiella Urinary tract infection: This is SENT to Zosyn.Continue ZOSYN.  3. Nausea/vomiting: This is likely secondary to problem #2. She seems to have tolerated her diet. I will continue supportive care  4. Dehydration: Her creatinine has improved. D/c IVF. She is eating  5. Dementia: Patient will continue her outpatient medications which include Aricept and Prozac.  6. GPC bacteremia: Start [facility] and if this is not contaminant will need ECHO and ID consult in am.    Management plans discussed with the patient's family and they are in agreement.  CODE STATUS: DNR  TOTAL TIME TAKING CARE OF THIS PATIENT: 25 minutes.     POSSIBLE D/C 1-2 days back to home with caregivers. , DEPENDING ON CLINICAL CONDITION.   Catricia Scheerer M.D on 05/03/2015 at 10:55 AM  Between 7am to 6pm - Pager - [phone] After 6pm go to [email or web address] - password [email or web address] [facility]  [place] [facility]  Office  [phone]  CC: Primary care physician; No PCP Per Patient

## 2015-05-04 LAB — BASIC METABOLIC PANEL
Anion gap: 6 (ref 5–15)
BUN: 13 mg/dL (ref 6–20)
CO2: 22 mmol/L (ref 22–32)
CREATININE: 0.89 mg/dL (ref 0.44–1.00)
Calcium: 7.9 mg/dL — ABNORMAL LOW (ref 8.9–10.3)
Chloride: 113 mmol/L — ABNORMAL HIGH (ref 101–111)
GFR calc Af Amer: >60 mL/min (ref >60)
GFR calc non Af Amer: 59 mL/min — ABNORMAL LOW (ref >60)
GLUCOSE: 94 mg/dL (ref 65–99)
POTASSIUM: 3.3 mmol/L — AB (ref 3.5–5.1)
SODIUM: 141 mmol/L (ref 135–145)

## 2015-05-04 MED ORDER — VANCOMYCIN HCL IN DEXTROSE 1-5 GM/200ML-% IV SOLN
1000.0000 mg | INTRAVENOUS | Status: DC
  Administered 2015-05-04: 1000 mg via INTRAVENOUS
  Filled 2015-05-04 (×2): qty 200

## 2015-05-04 NOTE — Care Management Important Message (Signed)
Important Message  Patient Details  Name: Cynthia Robles MRN: [id number] Date of Birth: June 11, 1931   Medicare Important Message Given:  Yes-second notification given    [name] 05/04/2015, 11:29 AM

## 2015-05-04 NOTE — Plan of Care (Signed)
Problem: Discharge Progression Outcomes Goal: Other Discharge Outcomes/Goals Outcome: Progressing 1. Pain: No complaints of pain. 2. Hemodynamically Stable: Remains on IV Antibiotics. 3. Complications:  Remains on IV Antibiotics. 4. Diet: Regular Diet. Tolerating Well. 5. Activity: Bedrest.

## 2015-05-04 NOTE — Progress Notes (Signed)
[facility] Physicians - [place] at [facility]   PATIENT NAME: Cynthia Robles    MR#:  [id number]  DATE OF BIRTH:  12-29-1930  SUBJECTIVE:  Intended unable to review of systems but appears comfortable. REVIEW OF SYSTEMS:    Review of Systems  Unable to perform ROS: dementia    Tolerating Diet: Yes      DRUG ALLERGIES:   Allergies  Allergen Reactions  . Flagyl [Metronidazole] Other (See Comments)    Reaction:  Unknown   . Levaquin [Levofloxacin In D5w] Hives    VITALS:  Blood pressure 171/84, pulse 67, temperature 98 F (36.7 C), temperature source Oral, resp. rate 18, height  (1.575 m), weight 56.065 kg (123 lb 9.6 oz), SpO2 96 %.  PHYSICAL EXAMINATION:   Physical Exam  Constitutional: She is well-developed, well-nourished, and in no distress. No distress.  HENT:  Head: Normocephalic.  Eyes: No scleral icterus.  Neck: Normal range of motion. No JVD present. No tracheal deviation present.  Cardiovascular: Normal rate, regular rhythm and normal heart sounds.  Exam reveals no gallop and no friction rub.   No murmur heard. Pulmonary/Chest: Effort normal and breath sounds normal. No respiratory distress. She has no wheezes. She has no rales. She exhibits no tenderness.  Abdominal: Soft. Bowel sounds are normal. She exhibits no distension and no mass. There is no tenderness. There is no rebound and no guarding.  Musculoskeletal: Normal range of motion. She exhibits no edema.  Neurological: She is alert. No cranial nerve deficit.  Skin: Skin is warm. No rash noted. No erythema.  Psychiatric: Affect normal.      LABORATORY PANEL:   CBC  Recent Labs Lab 05/03/15 0436  WBC 13.4*  HGB 9.4*  HCT 29.5*  PLT 290   ------------------------------------------------------------------------------------------------------------------  Chemistries   Recent Labs Lab 05/01/15 1820  05/04/15 0405  NA 134*  < > 141  K 3.4*  < > 3.3*  CL 97*  < >  113*  CO2 25  < > 22  GLUCOSE 124*  < > 94  BUN 26*  < > 13  CREATININE 1.21*  < > 0.89  CALCIUM 9.0  < > 7.9*  AST 27  --   --   ALT 17  --   --   ALKPHOS 59  --   --   BILITOT 0.5  --   --   < > = values in this interval not displayed. ------------------------------------------------------------------------------------------------------------------  Cardiac Enzymes  Recent Labs Lab 05/01/15 1820  TROPONINI <0.03   ------------------------------------------------------------------------------------------------------------------  RADIOLOGY:  No results found.   ASSESSMENT AND PLAN:   81 year old female with dementia who was treated as an outpatient for urinary tract infection presented with decreased alertness and vomiting. She is found to have  1. Sepsis: Patient presented with tachycardia, leukocytosis and fever. Sepsis secondary to urinary tract infection and now with 2/2 GPC bacteremia.  Continue VANC (pharamacy dosing), follow full blood culture results.  2. Klebsiella Urinary tract infection: This is SENT to Zosyn.Continue ZOSYN.  3. Nausea/vomiting: This is likely secondary to problem #2. She seems to have tolerated her diet. I will continue supportive care  4. Dehydration: Her creatinine has improved. D/c IVF. She is eating  5. Dementia: Patient will continue her outpatient medications which include Aricept and Prozac.  6. GPC bacteremia: Complete blood culture report is pending. Patient has no fever but white count is still little bit high. Follow closely Likely discharge tomorrow.  Management plans discussed with  the patient's family and they are in agreement.  CODE STATUS: DNR  TOTAL TIME TAKING CARE OF THIS PATIENT: 25 minutes.     POSSIBLE D/C 1-2 days back to home with caregivers. , DEPENDING ON CLINICAL CONDITION.   Katha Hamming M.D on 05/04/2015 at 10:40 AM  Between 7am to 6pm - Pager - [phone] After 6pm go to [email or web address] -  password [email or web address] [facility]  [place] [facility]  Office  [phone]  CC: Primary care physician; No PCP Per Patient

## 2015-05-04 NOTE — Plan of Care (Signed)
Problem: Discharge Progression Outcomes Goal: Other Discharge Outcomes/Goals 1. Pain:          Denies pain. 2. Hemodynamically Stable:          Remains on IV Antibiotics.         Foley Catheter intact with yellow urine output. 3. Complications:         Remains on IV Antibiotics. 4. Diet:         Regular Diet.         Tolerating Well.         Appetite Increasing. 5. Activity:         Bedrest.

## 2015-05-04 NOTE — Progress Notes (Signed)
ANTIBIOTIC CONSULT NOTE -FOLLOW UP   Pharmacy Consult for Vancomycin Indication: rule out sepsis  Allergies  Allergen Reactions  . Flagyl [Metronidazole] Other (See Comments)    Reaction:  Unknown   . Levaquin [Levofloxacin In D5w] Hives    Patient Measurements: Height:  (157.5 cm) Weight: 123 lb 9.6 oz (56.065 kg) IBW/kg (Calculated) : 50.1   Vital Signs: Temp: 98 F (36.7 C) (08/01 0511) Temp Source: Oral (08/01 0511) BP: 171/84 mmHg (08/01 0511) Pulse Rate: 67 (08/01 0511) Intake/Output from previous day: 07/31 0701 - 08/01 0700 In: 530 [P.O.:480; IV Piggyback:50] Out: 1150 [Urine:1150] Intake/Output from this shift:    Labs:  Recent Labs  05/01/15 1820 05/02/15 0449 05/03/15 0436 05/04/15 0405  WBC 15.4* 14.3* 13.4*  --   HGB 10.7* 9.1* 9.4*  --   PLT 386 289 290  --   CREATININE 1.21* 1.02*  --  0.89   Estimated Creatinine Clearance: 39.2 mL/min (by C-G formula based on Cr of 0.89). No results for input(s): VANCOTROUGH, VANCOPEAK, VANCORANDOM, GENTTROUGH, GENTPEAK, GENTRANDOM, TOBRATROUGH, TOBRAPEAK, TOBRARND, AMIKACINPEAK, AMIKACINTROU, AMIKACIN in the last 72 hours.   Microbiology: Recent Results (from the past 720 hour(s))  Blood Culture (routine x 2)     Status: None (Preliminary result)   Collection Time: 05/01/15  6:20 PM  Result Value Ref Range Status   Specimen Description BLOOD LEFT FATTY CASTS  Final   Special Requests BOTTLES DRAWN AEROBIC AND ANAEROBIC  1CC  Final   Culture  Setup Time   Final    GRAM POSITIVE COCCI IN CHAINS IN BOTH AEROBIC AND ANAEROBIC BOTTLES IDENTIFICATION TO FOLLOW CRITICAL RESULT CALLED TO, READ BACK BY AND VERIFIED WITH: CHRISTA YOUNG ON 05/02/15 AT 1210PM BY JEF CRITICAL VALUE NOTED.  VALUE IS CONSISTENT WITH PREVIOUSLY REPORTED AND CALLED VALUE.    Culture   Final    GRAM POSITIVE COCCI IN CHAINS IDENTIFICATION TO FOLLOW IN BOTH AEROBIC AND ANAEROBIC BOTTLES    Report Status PENDING  Incomplete  Blood  Culture (routine x 2)     Status: None (Preliminary result)   Collection Time: 05/01/15  6:20 PM  Result Value Ref Range Status   Specimen Description BLOOD LEFT WRIST  Final   Special Requests BOTTLES DRAWN AEROBIC AND ANAEROBIC  1CC  Final   Culture  Setup Time   Final    GRAM POSITIVE COCCI IN CLUSTERS IN BOTH AEROBIC AND ANAEROBIC BOTTLES IDENTIFICATION TO FOLLOW CRITICAL RESULT CALLED TO, READ BACK BY AND VERIFIED WITH: CHRISTA YOUNG ON 05/02/15 AT 1650 BY JEF    Culture   Final    GRAM POSITIVE COCCI IN CLUSTERS IN BOTH AEROBIC AND ANAEROBIC BOTTLES IDENTIFICATION TO FOLLOW    Report Status PENDING  Incomplete  Urine culture     Status: None   Collection Time: 05/01/15  6:20 PM  Result Value Ref Range Status   Specimen Description URINE, RANDOM  Final   Special Requests NONE  Final   Culture >=100,000 COLONIES/mL KLEBSIELLA OXYTOCA  Final   Report Status 05/03/2015 FINAL  Final   Organism ID, Bacteria KLEBSIELLA OXYTOCA  Final      Susceptibility   Klebsiella oxytoca - MIC*    AMPICILLIN >=32 RESISTANT Resistant     CEFTAZIDIME <=1 SENSITIVE Sensitive     CEFAZOLIN 8 SENSITIVE Sensitive     CEFTRIAXONE <=1 SENSITIVE Sensitive     CIPROFLOXACIN <=0.25 SENSITIVE Sensitive     GENTAMICIN <=1 SENSITIVE Sensitive     IMIPENEM <=0.25 SENSITIVE  Sensitive     TRIMETH/SULFA <=20 SENSITIVE Sensitive     NITROFURANTOIN Value in next row Resistant      RESISTANT256    PIP/TAZO Value in next row Sensitive      SENSITIVE<=4    * >=100,000 COLONIES/mL KLEBSIELLA OXYTOCA    Medical History: Past Medical History  Diagnosis Date  . Anxiety   . Alzheimer disease   . Dementia   . High cholesterol   . Alzheimer disease   . Osteoporosis      Assessment: This 81 year old female patient has been ordered Vancomycin in addition to Zosyn for treatment of sepsis  CrCl ~ 38.31, ke = 0.036, Vd = 39.27, T 1/2 = 19.25  Updated PK:  Kel (hr-1): 0.038 Half-life (hrs): 18.24 Vd  (liters): 39.27 (factor used: 0.7 L/kg)   Goal of Therapy:  Vancomycin trough level 15-20 mcg/ml  Plan:  Will change to VAncomycin 1 g IV q24 hours and will order Vancomycin trough level prior to the 17:00 dose on 8/4.   Eliezer Khawaja D 05/04/2015,8:35 AM

## 2015-05-05 DIAGNOSIS — L8990 Pressure ulcer of unspecified site, unspecified stage: Secondary | ICD-10-CM | POA: Insufficient documentation

## 2015-05-05 LAB — CBC
HEMATOCRIT: 29.2 % — AB (ref 35.0–47.0)
Hemoglobin: 9.4 g/dL — ABNORMAL LOW (ref 12.0–16.0)
MCH: 27.4 pg (ref 26.0–34.0)
MCHC: 32.4 g/dL (ref 32.0–36.0)
MCV: 84.8 fL (ref 80.0–100.0)
Platelets: 350 10*3/uL (ref 150–440)
RBC: 3.44 MIL/uL — ABNORMAL LOW (ref 3.80–5.20)
RDW: 16.3 % — AB (ref 11.5–14.5)
WBC: 11.7 10*3/uL — ABNORMAL HIGH (ref 3.6–11.0)

## 2015-05-05 LAB — CULTURE, BLOOD (ROUTINE X 2)

## 2015-05-05 LAB — C DIFFICILE QUICK SCREEN W PCR REFLEX
C Diff antigen: NEGATIVE
C Diff interpretation: NEGATIVE
C Diff toxin: NEGATIVE

## 2015-05-05 MED ORDER — CEFDINIR 300 MG PO CAPS: 300.0000 mg | ORAL_CAPSULE | Freq: Two times a day (BID) | ORAL | Status: DC

## 2015-05-05 MED ORDER — ENSURE ENLIVE PO LIQD
237.0000 mL | Freq: Two times a day (BID) | ORAL | Status: DC
  Administered 2015-05-05: 237 mL via ORAL

## 2015-05-05 MED ORDER — CIPROFLOXACIN HCL 500 MG PO TABS: 500.0000 mg | ORAL_TABLET | Freq: Two times a day (BID) | ORAL | Status: DC

## 2015-05-05 NOTE — Plan of Care (Signed)
Problem: Discharge Progression Outcomes Goal: Other Discharge Outcomes/Goals Outcome: Adequate for Discharge Plan of care progress to goal for: UTI Received MD orders to discharge patient to home Continues on oral ABX ] No c/o pain  Patient is incontinent of bowel and bladder. Caregivers at bedside, patient discharge in wheelchair with nursing and caregivers

## 2015-05-05 NOTE — Progress Notes (Signed)
Pt being discharged home, discharge instructions reviewed with daughter, states understanding, pt with no noted complaints at discharge

## 2015-05-05 NOTE — Plan of Care (Signed)
Problem: Discharge Progression Outcomes Goal: Other Discharge Outcomes/Goals Plan of care progress to goal for: UTI - Continues on ABX. - No complaints of pain. - Incontinent of bowel and bladder. - Continues on ABX. - Caregivers at bedside, will continue to monitor.

## 2015-05-05 NOTE — Discharge Instructions (Signed)
Sepsis Sepsis is a serious infection of your blood or tissues that affects your whole body. The infection that causes sepsis may be bacterial, viral, fungal, or parasitic. Sepsis may be life threatening. Sepsis can cause your blood pressure to drop. This may result in shock. Shock causes your central nervous system and your organs to stop working correctly.  RISK FACTORS Sepsis can happen in anyone, but it is more likely to happen in people who have weakened immune systems. SIGNS AND SYMPTOMS  Symptoms of sepsis can include:  Fever or low body temperature (hypothermia).  Rapid breathing (hyperventilation).  Chills.  Rapid heartbeat (tachycardia).  Confusion or light-headedness.  Trouble breathing.  Urinating much less than usual.  Cool, clammy skin or red, flushed skin.  Other problems with the heart, kidneys, or brain. DIAGNOSIS  Your health care provider will likely do tests to look for an infection, to see if the infection has spread to your blood, and to see how serious your condition is. Tests can include:  Blood tests, including cultures of your blood.  Cultures of other fluids from your body, such as:  Urine.  Pus from wounds.  Mucus coughed up from your lungs.  Urine tests other than cultures.  X-ray exams or other imaging tests. TREATMENT  Treatment will begin with elimination of the source of infection. If your sepsis is likely caused by a bacterial or fungal infection, you will be given antibiotic or antifungal medicines. You may also receive:  Oxygen.  Fluids through an IV tube.  Medicines to increase your blood pressure.  A machine to clean your blood (dialysis) if your kidneys fail.  A machine to help you breathe if your lungs fail. SEEK IMMEDIATE MEDICAL CARE IF: You get an infection or develop any of the signs and symptoms of sepsis after surgery or a hospitalization. Document Released: 06/18/2003 Document Revised: 09/24/2013 Document Reviewed:  05/27/2013 [facility] Patient Information 2015 [place], [place]. This information is not intended to replace advice given to you by your health care provider. Make sure you discuss any questions you have with your health care provider.  Urinary Tract Infection A urinary tract infection (UTI) can occur any place along the urinary tract. The tract includes the kidneys, ureters, bladder, and urethra. A type of germ called bacteria often causes a UTI. UTIs are often helped with antibiotic medicine.  HOME CARE   If given, take antibiotics as told by your doctor. Finish them even if you start to feel better.  Drink enough fluids to keep your pee (urine) clear or pale yellow.  Avoid tea, drinks with caffeine, and bubbly (carbonated) drinks.  Pee often. Avoid holding your pee in for a long time.  Pee before and after having sex (intercourse).  Wipe from front to back after you poop (bowel movement) if you are a woman. Use each tissue only once. GET HELP RIGHT AWAY IF:   You have back pain.  You have lower belly (abdominal) pain.  You have chills.  You feel sick to your stomach (nauseous).  You throw up (vomit).  Your burning or discomfort with peeing does not go away.  You have a fever.  Your symptoms are not better in 3 days. MAKE SURE YOU:   Understand these instructions.  Will watch your condition.  Will get help right away if you are not doing well or get worse. Document Released: 03/07/2008 Document Revised: 06/13/2012 Document Reviewed: 04/19/2012 [facility] Patient Information 2015 [place], [place]. This information is not intended to replace  advice given to you by your health care provider. Make sure you discuss any questions you have with your health care provider. ° °

## 2015-05-07 NOTE — Discharge Summary (Signed)
Cynthia Robles, is a 81 y.o. female  DOB December 08, 1930  MRN [id number].  Admission date:  05/01/2015  Admitting Physician  Gracelyn Nurse, MD  Discharge Date:  05/05/2015   Primary MD  No PCP Per Patient  Recommendations for primary care physician for things to follow:  followp with her PMD in one week   Admission Diagnosis  Fever and chills [R50.9] UTI (lower urinary tract infection) [N39.0] Sepsis, due to unspecified organism [A41.9] Altered mental status, unspecified altered mental status type [R41.82]   Discharge Diagnosis  Fever and chills [R50.9] UTI (lower urinary tract infection) [N39.0] Sepsis, due to unspecified organism [A41.9] Altered mental status, unspecified altered mental status type [R41.82]   Active Problems:   Sepsis   Pressure ulcer      Past Medical History  Diagnosis Date  . Anxiety   . Alzheimer disease   . Dementia   . High cholesterol   . Alzheimer disease   . Osteoporosis     History reviewed. No pertinent past surgical history.     History of present illness and  Hospital Course:     Kindly see H&P for history of present illness and admission details, please review complete Labs, Consult reports and Test reports for all details in brief  HPI  from the history and physical done on the day of admission 81 yr old female with dementia ,who was treated for out pt abx for UTI comes in with decreased  Alertness,vomiting.found to have sepsis with fever.leucocytosis;admitted to Hospitalist service for this.   Hospital Course   1.Sepsos due to UTI;Urine cultures showed Klebsiella;intiially received zosyn but urine cultures are S to [facility] and Kelfex.discharged home with Omnicef.blood cultures  Showed coag negative staph with contamination .her WBC also improved.,pt remained  afebrile,h,family wanted to take her home with 24 hr sitters.so discharged home. 2.Nasuea/vomiting;resolved,intially received IV fluids,but she stared to tolerate diet,so we discontinued IV fluids. 3.Dementia;on Aricept,Prozac  Diarrhea;checked stool for cdiff;its is negative. Code;DNR   Discharge Condition: stable   Follow UP   with her PMD in one week   Discharge Instructions  and  Discharge Medications      Medication List    TAKE these medications        acetaminophen 500 MG tablet  Commonly known as:  TYLENOL  Take 500 mg by mouth 2 (two) times daily.     ALPRAZolam 0.25 MG tablet  Commonly known as:  XANAX  Take 0.25 mg by mouth 2 (two) times daily.     aspirin EC 81 MG tablet  Take 81 mg by mouth daily.     Calcium-D 600-400 MG-UNIT Tabs  Take 1 tablet by mouth daily.     cefdinir 300 MG capsule  Commonly known as:  OMNICEF  Take 1 capsule (300 mg total) by mouth 2 (two) times daily.     denosumab 60 MG/ML Soln injection  Commonly known as:  PROLIA  Inject 60 mg into the skin every 6 (six) months. Administer in upper arm, thigh, or abdomen     diphenhydrAMINE 25 mg capsule  Commonly known as:  BENADRYL  Take 25 mg by mouth 2 (two) times daily.     donepezil 10 MG tablet  Commonly known as:  ARICEPT  Take 10 mg by mouth daily.     FLUoxetine 10 MG capsule  Commonly known as:  PROZAC  Take 10 mg by mouth 2 (two) times daily.     furosemide 20 MG tablet  Commonly known as:  LASIX  Take 20 mg by mouth daily.     MULTIVITAMIN PO  Take 1 tablet by mouth daily.          Diet and Activity recommendation: See Discharge Instructions above   Consults obtained -   Major procedures and Radiology Reports - PLEASE review detailed and final reports for all details, in brief -     Ct Head Wo Contrast  05/01/2015   CLINICAL DATA:  81 year old female with altered mental status and fever.  EXAM: CT HEAD WITHOUT CONTRAST  TECHNIQUE: Contiguous axial  images were obtained from the base of the skull through the vertex without intravenous contrast.  COMPARISON:  11/29/2011 CT  FINDINGS: Moderate atrophy and chronic small-vessel white matter ischemic changes again noted.  A remote right cerebellar infarct is present.  No acute intracranial abnormalities are identified, including mass lesion or mass effect, hydrocephalus, extra-axial fluid collection, midline shift, hemorrhage, or acute infarction.  The visualized bony calvarium is unremarkable.  IMPRESSION: No evidence of acute intracranial abnormality.  Atrophy, chronic small-vessel white matter ischemic changes and remote right cerebellar infarct.   Electronically Signed   By: Harmon Pier M.D.   On: 05/01/2015 19:46   Dg Chest Port 1 View  05/01/2015   CLINICAL DATA:  Altered mental status.  Emesis.  Fever  EXAM: PORTABLE CHEST - 1 VIEW  COMPARISON:  None.  FINDINGS: There is a shallow inspiration. There is mild chronic appearing interstitial coarsening. No confluent airspace opacity is evident. No effusion is evident. Heart size is normal.  IMPRESSION: Chronic appearing interstitial coarsening. No acute cardiopulmonary findings.   Electronically Signed   By: Ellery Plunk M.D.   On: 05/01/2015 19:03    Micro Results    Recent Results (from the past 240 hour(s))  Blood Culture (routine x 2)     Status: None   Collection Time: 05/01/15  6:20 PM  Result Value Ref Range Status   Specimen Description BLOOD LEFT FATTY CASTS  Final   Special Requests BOTTLES DRAWN AEROBIC AND ANAEROBIC  1CC  Final   Culture  Setup Time   Final    GRAM POSITIVE COCCI IN CHAINS IN BOTH AEROBIC AND ANAEROBIC BOTTLES CRITICAL RESULT CALLED TO, READ BACK BY AND VERIFIED WITH: CHRISTA YOUNG ON 05/02/15 AT 1210PM BY JEF CRITICAL VALUE NOTED.  VALUE IS CONSISTENT WITH PREVIOUSLY REPORTED AND CALLED VALUE.    Culture   Final    MULTIPLE SPECIES OF COAGULASE NEGATIVE STAPH PRESENT SUGGESTIVE OF CONTAMINATION WITH SKIN  FLORA CALL MICROBIOLOGY LAB IF SENSITIVITIES ARE REQUIRED.    Report Status 05/05/2015 FINAL  Final  Blood Culture (routine x 2)     Status: None   Collection Time: 05/01/15  6:20 PM  Result Value Ref Range Status   Specimen Description BLOOD LEFT WRIST  Final   Special Requests BOTTLES DRAWN AEROBIC AND ANAEROBIC  1CC  Final   Culture  Setup Time   Final    GRAM POSITIVE COCCI IN CLUSTERS IN BOTH AEROBIC AND ANAEROBIC BOTTLES CRITICAL RESULT CALLED TO, READ BACK BY AND VERIFIED WITH: CHRISTA YOUNG ON 05/02/15 AT 1650 BY JEF    Culture   Final    MULTIPLE SPECIES OF COAGULASE NEGATIVE STAPH PRESENT SUGGESTIVE OF CONTAMINATION WITH SKIN FLORA  CALL MICROBIOLOGY LAB IF SENSITIVITIES ARE REQUIRED.    Report Status 05/05/2015 FINAL  Final  Urine culture     Status: None   Collection Time: 05/01/15  6:20 PM  Result Value Ref Range Status   Specimen Description URINE, RANDOM  Final   Special Requests NONE  Final   Culture >=100,000 COLONIES/mL KLEBSIELLA OXYTOCA  Final   Report Status 05/03/2015 FINAL  Final   Organism ID, Bacteria KLEBSIELLA OXYTOCA  Final      Susceptibility   Klebsiella oxytoca - MIC*    AMPICILLIN >=32 RESISTANT Resistant     CEFTAZIDIME <=1 SENSITIVE Sensitive     CEFAZOLIN 8 SENSITIVE Sensitive     CEFTRIAXONE <=1 SENSITIVE Sensitive     CIPROFLOXACIN <=0.25 SENSITIVE Sensitive     GENTAMICIN <=1 SENSITIVE Sensitive     IMIPENEM <=0.25 SENSITIVE Sensitive     TRIMETH/SULFA <=20 SENSITIVE Sensitive     NITROFURANTOIN Value in next row Resistant      RESISTANT256    PIP/TAZO Value in next row Sensitive      SENSITIVE<=4    * >=100,000 COLONIES/mL KLEBSIELLA OXYTOCA  Culture, blood (routine x 2)     Status: None (Preliminary result)   Collection Time: 05/03/15  8:49 AM  Result Value Ref Range Status   Specimen Description BLOOD RIGHT HAND  Final   Special Requests BOTTLES DRAWN AEROBIC AND ANAEROBIC  9CC  Final   Culture NO GROWTH 3 DAYS  Final   Report  Status PENDING  Incomplete  Culture, blood (routine x 2)     Status: None (Preliminary result)   Collection Time: 05/03/15  9:00 AM  Result Value Ref Range Status   Specimen Description BLOOD LEFT HAND  Final   Special Requests BOTTLES DRAWN AEROBIC AND ANAEROBIC  10CC  Final   Culture NO GROWTH 3 DAYS  Final   Report Status PENDING  Incomplete  C difficile quick scan w PCR reflex ([facility] only)     Status: None   Collection Time: 05/05/15  6:48 AM  Result Value Ref Range Status   C Diff antigen NEGATIVE NEGATIVE Final   C Diff toxin NEGATIVE NEGATIVE Final   C Diff interpretation Negative for C. difficile  Final       Today   Subjective:   Cynthia Robles today ,awake,baseline dementia,stable for discharge.  Objective:   Blood pressure 147/78, pulse 66, temperature 98.7 F (37.1 C), temperature source Oral, resp. rate 18, height 5\' 2"  (1.575 m), weight 56.065 kg (123 lb 9.6 oz), SpO2 96 %.  No intake or output data in the 24 hours ending 05/07/15 0803  Exam Awake Alert, Oriented x 3, No new F.N deficits, Normal affect [place].AT,PERRAL Supple Neck,No JVD, No cervical lymphadenopathy appriciated.  Symmetrical Chest wall movement, Good air movement bilaterally, CTAB RRR,No Gallops,Rubs or new Murmurs, No Parasternal Heave +ve B.Sounds, Abd Soft, Non tender, No organomegaly appriciated, No rebound -guarding or rigidity. No Cyanosis, Clubbing or edema, No new Rash or bruise  Data Review   CBC w Diff:  Lab Results  Component Value Date   WBC 11.7* 05/05/2015   HGB 9.4* 05/05/2015   HCT 29.2* 05/05/2015   PLT 350 05/05/2015   LYMPHOPCT 7 05/01/2015   MONOPCT 5 05/01/2015   EOSPCT 6 05/01/2015   BASOPCT 0 05/01/2015    CMP:  Lab Results  Component Value Date   NA 141 05/04/2015   K 3.3* 05/04/2015   CL 113* 05/04/2015   CO2 22 05/04/2015   BUN 13 05/04/2015   CREATININE 0.89 05/04/2015   PROT 7.1 05/01/2015   ALBUMIN 3.6 05/01/2015   BILITOT 0.5 05/01/2015    ALKPHOS 59 05/01/2015   AST  27 05/01/2015   ALT 17 05/01/2015  .   Total Time in preparing paper work, data evaluation and todays exam - 35 minutes  Cynthia Robles M.D on 05/05/2015 at 8:03 AM

## 2015-05-08 LAB — CULTURE, BLOOD (ROUTINE X 2)
Culture: NO GROWTH
Culture: NO GROWTH

## 2015-05-11 ENCOUNTER — Ambulatory Visit: Payer: Medicare PPO

## 2015-05-18 ENCOUNTER — Ambulatory Visit (INDEPENDENT_AMBULATORY_CARE_PROVIDER_SITE_OTHER): Payer: Medicare PPO | Admitting: Podiatry

## 2015-05-18 DIAGNOSIS — B351 Tinea unguium: Secondary | ICD-10-CM | POA: Diagnosis not present

## 2015-05-18 DIAGNOSIS — M79676 Pain in unspecified toe(s): Secondary | ICD-10-CM | POA: Diagnosis not present

## 2015-05-18 NOTE — Progress Notes (Signed)
She presents today with her assistant chief complaint of painful elongated toenails bilateral. Her aids states that her Alzheimer's is progressing very rapidly.  Objective: Pulses are palpable bilateral. Nails are thick yellow dystrophic onychomycotic and painful palpation. She is alert but not oriented.  Assessment: Pain in limb secondary to onychomycosis 1 through 5 bilateral. Severe Alzheimer's disease.  Plan: Debridement of nails 1 through 5 bilateral foot. covered service secondary to pain. 3 mo.

## 2015-06-27 ENCOUNTER — Emergency Department: Payer: Medicare PPO

## 2015-06-27 ENCOUNTER — Inpatient Hospital Stay: Payer: Medicare PPO

## 2015-06-27 ENCOUNTER — Inpatient Hospital Stay
Admission: EM | Admit: 2015-06-27 | Discharge: 2015-07-01 | DRG: 871 | Payer: Medicare PPO | Attending: Internal Medicine | Admitting: Internal Medicine

## 2015-06-27 DIAGNOSIS — L89159 Pressure ulcer of sacral region, unspecified stage: Secondary | ICD-10-CM | POA: Diagnosis present

## 2015-06-27 DIAGNOSIS — Z79899 Other long term (current) drug therapy: Secondary | ICD-10-CM | POA: Diagnosis not present

## 2015-06-27 DIAGNOSIS — M810 Age-related osteoporosis without current pathological fracture: Secondary | ICD-10-CM | POA: Diagnosis present

## 2015-06-27 DIAGNOSIS — G9341 Metabolic encephalopathy: Secondary | ICD-10-CM | POA: Diagnosis present

## 2015-06-27 DIAGNOSIS — D539 Nutritional anemia, unspecified: Secondary | ICD-10-CM | POA: Diagnosis present

## 2015-06-27 DIAGNOSIS — R627 Adult failure to thrive: Secondary | ICD-10-CM

## 2015-06-27 DIAGNOSIS — F0280 Dementia in other diseases classified elsewhere without behavioral disturbance: Secondary | ICD-10-CM | POA: Diagnosis present

## 2015-06-27 DIAGNOSIS — E876 Hypokalemia: Secondary | ICD-10-CM | POA: Diagnosis present

## 2015-06-27 DIAGNOSIS — L8990 Pressure ulcer of unspecified site, unspecified stage: Secondary | ICD-10-CM | POA: Diagnosis present

## 2015-06-27 DIAGNOSIS — Z7982 Long term (current) use of aspirin: Secondary | ICD-10-CM | POA: Diagnosis not present

## 2015-06-27 DIAGNOSIS — Z66 Do not resuscitate: Secondary | ICD-10-CM | POA: Diagnosis present

## 2015-06-27 DIAGNOSIS — Z681 Body mass index (BMI) 19 or less, adult: Secondary | ICD-10-CM | POA: Diagnosis not present

## 2015-06-27 DIAGNOSIS — N170 Acute kidney failure with tubular necrosis: Secondary | ICD-10-CM | POA: Diagnosis present

## 2015-06-27 DIAGNOSIS — E780 Pure hypercholesterolemia: Secondary | ICD-10-CM | POA: Diagnosis present

## 2015-06-27 DIAGNOSIS — A419 Sepsis, unspecified organism: Principal | ICD-10-CM | POA: Diagnosis present

## 2015-06-27 DIAGNOSIS — B965 Pseudomonas (aeruginosa) (mallei) (pseudomallei) as the cause of diseases classified elsewhere: Secondary | ICD-10-CM | POA: Diagnosis present

## 2015-06-27 DIAGNOSIS — Z515 Encounter for palliative care: Secondary | ICD-10-CM | POA: Diagnosis not present

## 2015-06-27 DIAGNOSIS — E860 Dehydration: Secondary | ICD-10-CM | POA: Diagnosis present

## 2015-06-27 DIAGNOSIS — IMO0001 Reserved for inherently not codable concepts without codable children: Secondary | ICD-10-CM | POA: Insufficient documentation

## 2015-06-27 DIAGNOSIS — N390 Urinary tract infection, site not specified: Secondary | ICD-10-CM | POA: Diagnosis present

## 2015-06-27 DIAGNOSIS — E43 Unspecified severe protein-calorie malnutrition: Secondary | ICD-10-CM | POA: Diagnosis present

## 2015-06-27 DIAGNOSIS — R6520 Severe sepsis without septic shock: Secondary | ICD-10-CM | POA: Diagnosis present

## 2015-06-27 DIAGNOSIS — L89223 Pressure ulcer of left hip, stage 3: Secondary | ICD-10-CM | POA: Diagnosis present

## 2015-06-27 DIAGNOSIS — Z87440 Personal history of urinary (tract) infections: Secondary | ICD-10-CM

## 2015-06-27 DIAGNOSIS — R630 Anorexia: Secondary | ICD-10-CM | POA: Diagnosis present

## 2015-06-27 DIAGNOSIS — N179 Acute kidney failure, unspecified: Secondary | ICD-10-CM

## 2015-06-27 DIAGNOSIS — N281 Cyst of kidney, acquired: Secondary | ICD-10-CM | POA: Diagnosis present

## 2015-06-27 DIAGNOSIS — E870 Hyperosmolality and hypernatremia: Secondary | ICD-10-CM | POA: Diagnosis present

## 2015-06-27 DIAGNOSIS — N19 Unspecified kidney failure: Secondary | ICD-10-CM

## 2015-06-27 DIAGNOSIS — G9340 Encephalopathy, unspecified: Secondary | ICD-10-CM | POA: Diagnosis present

## 2015-06-27 DIAGNOSIS — G309 Alzheimer's disease, unspecified: Secondary | ICD-10-CM | POA: Diagnosis present

## 2015-06-27 DIAGNOSIS — Z888 Allergy status to other drugs, medicaments and biological substances status: Secondary | ICD-10-CM

## 2015-06-27 LAB — COMPREHENSIVE METABOLIC PANEL
ALBUMIN: 2.7 g/dL — AB (ref 3.5–5.0)
ALK PHOS: 100 U/L (ref 38–126)
ALT: 37 U/L (ref 14–54)
AST: 43 U/L — ABNORMAL HIGH (ref 15–41)
Anion gap: 13 (ref 5–15)
BILIRUBIN TOTAL: 0.5 mg/dL (ref 0.3–1.2)
BUN: 128 mg/dL — ABNORMAL HIGH (ref 6–20)
CO2: 29 mmol/L (ref 22–32)
CREATININE: 3.69 mg/dL — AB (ref 0.44–1.00)
Calcium: 12.6 mg/dL — ABNORMAL HIGH (ref 8.9–10.3)
Chloride: 119 mmol/L — ABNORMAL HIGH (ref 101–111)
GFR calc Af Amer: 12 mL/min — ABNORMAL LOW (ref >60)
GFR calc non Af Amer: 11 mL/min — ABNORMAL LOW (ref >60)
GLUCOSE: 131 mg/dL — AB (ref 65–99)
Potassium: 3.8 mmol/L (ref 3.5–5.1)
Sodium: 161 mmol/L (ref 135–145)
Total Protein: 8.1 g/dL (ref 6.5–8.1)

## 2015-06-27 LAB — URINALYSIS COMPLETE WITH MICROSCOPIC (ARMC ONLY)
Bilirubin Urine: NEGATIVE
GLUCOSE, UA: NEGATIVE mg/dL
KETONES UR: NEGATIVE mg/dL
Nitrite: NEGATIVE
PH: 5 (ref 5.0–8.0)
Protein, ur: 30 mg/dL — AB
Specific Gravity, Urine: 1.018 (ref 1.005–1.030)

## 2015-06-27 LAB — BASIC METABOLIC PANEL
ANION GAP: 7 (ref 5–15)
BUN: 125 mg/dL — ABNORMAL HIGH (ref 6–20)
CALCIUM: 11.3 mg/dL — AB (ref 8.9–10.3)
CO2: 30 mmol/L (ref 22–32)
CREATININE: 3.25 mg/dL — AB (ref 0.44–1.00)
Chloride: 124 mmol/L — ABNORMAL HIGH (ref 101–111)
GFR calc non Af Amer: 12 mL/min — ABNORMAL LOW (ref >60)
GFR, EST AFRICAN AMERICAN: 14 mL/min — AB (ref >60)
Glucose, Bld: 127 mg/dL — ABNORMAL HIGH (ref 65–99)
Potassium: 3.2 mmol/L — ABNORMAL LOW (ref 3.5–5.1)
SODIUM: 161 mmol/L — AB (ref 135–145)

## 2015-06-27 LAB — CBC
HEMATOCRIT: 32.3 % — AB (ref 35.0–47.0)
Hemoglobin: 9.9 g/dL — ABNORMAL LOW (ref 12.0–16.0)
MCH: 25.7 pg — AB (ref 26.0–34.0)
MCHC: 30.7 g/dL — AB (ref 32.0–36.0)
MCV: 83.6 fL (ref 80.0–100.0)
Platelets: 680 10*3/uL — ABNORMAL HIGH (ref 150–440)
RBC: 3.87 MIL/uL (ref 3.80–5.20)
RDW: 15.9 % — ABNORMAL HIGH (ref 11.5–14.5)
WBC: 39.1 10*3/uL — ABNORMAL HIGH (ref 3.6–11.0)

## 2015-06-27 LAB — LACTIC ACID, PLASMA
LACTIC ACID, VENOUS: 3.3 mmol/L — AB (ref 0.5–2.0)
Lactic Acid, Venous: 1.5 mmol/L (ref 0.5–2.0)

## 2015-06-27 LAB — TROPONIN I: Troponin I: 0.07 ng/mL — ABNORMAL HIGH (ref <0.031)

## 2015-06-27 MED ORDER — ONDANSETRON HCL 4 MG/2ML IJ SOLN: 4.0000 mg | Freq: Four times a day (QID) | INTRAMUSCULAR | Status: DC | PRN

## 2015-06-27 MED ORDER — ACETAMINOPHEN 650 MG RE SUPP: 650.0000 mg | Freq: Four times a day (QID) | RECTAL | Status: DC | PRN

## 2015-06-27 MED ORDER — DONEPEZIL HCL 5 MG PO TABS
10.0000 mg | ORAL_TABLET | Freq: Every day | ORAL | Status: DC
  Filled 2015-06-27: qty 2

## 2015-06-27 MED ORDER — ACETAMINOPHEN 325 MG PO TABS: 650.0000 mg | ORAL_TABLET | Freq: Four times a day (QID) | ORAL | Status: DC | PRN

## 2015-06-27 MED ORDER — SODIUM CHLORIDE 0.9 % IV SOLN
1000.0000 mL | Freq: Once | INTRAVENOUS | Status: AC
  Administered 2015-06-27: 1000 mL via INTRAVENOUS

## 2015-06-27 MED ORDER — SODIUM CHLORIDE 0.9 % IV SOLN
INTRAVENOUS | Status: DC
  Administered 2015-06-27: 1000 mL via INTRAVENOUS

## 2015-06-27 MED ORDER — FLUOXETINE HCL 10 MG PO CAPS
10.0000 mg | ORAL_CAPSULE | Freq: Two times a day (BID) | ORAL | Status: DC
  Filled 2015-06-27 (×4): qty 1

## 2015-06-27 MED ORDER — DEXTROSE 5 % IV SOLN
1.0000 g | Freq: Once | INTRAVENOUS | Status: AC
  Administered 2015-06-27: 1 g via INTRAVENOUS
  Filled 2015-06-27: qty 10

## 2015-06-27 MED ORDER — CEFTRIAXONE SODIUM 1 G IJ SOLR
1.0000 g | INTRAMUSCULAR | Status: DC
  Filled 2015-06-27: qty 10

## 2015-06-27 NOTE — H&P (Signed)
[facility] NAME: Cynthia Robles    MR#:  [id number]  DATE OF BIRTH:  [birth year]  DATE OF ADMISSION:  06/27/2015  PRIMARY CARE PHYSICIAN: Lenard Simmer, MD   REQUESTING/REFERRING PHYSICIAN: Dr. Corky Downs  CHIEF COMPLAINT:   Chief Complaint  Patient presents with  . Loss of Consciousness    HISTORY OF PRESENT ILLNESS:  Cynthia Robles  is a 81 y.o. female with a known history of Alzheimer's dementia, and deficiency anemia, recurrent UTIs brought in by family secondary to poor by mouth intake for the past 3 days. Patient has 24-hour caregivers at home, they noticed that for the past 3 days she is going downhill with the decreased by mouth intake, worsening dementia, difficulty swallowing. No episodes of nausea, vomiting, diarrhea, fever. According to the family also noticed bedsores in the sacral area. Patient's sodium found to be 161 today. An white count up to 39.  PAST MEDICAL HISTORY:   Past Medical History  Diagnosis Date  . Anxiety   . Alzheimer disease   . Dementia   . High cholesterol   . Alzheimer disease   . Osteoporosis     PAST SURGICAL HISTOIRY:  No past surgical history on file.  SOCIAL HISTORY:   Social History  Substance Use Topics  . Smoking status: Never Smoker   . Smokeless tobacco: Never Used  . Alcohol Use: No    FAMILY HISTORY:  No family history on file.  DRUG ALLERGIES:   Allergies  Allergen Reactions  . Flagyl [Metronidazole] Other (See Comments)    Reaction:  Unknown   . Levaquin [Levofloxacin In D5w] Hives   Unable to obtain review of systems secondary to dementia.    MEDICATIONS AT HOME:   Prior to Admission medications   Medication Sig Start Date End Date Taking? Authorizing Provider  acetaminophen (TYLENOL) 500 MG tablet Take 500 mg by mouth 2 (two) times daily.     Historical Provider, MD  ALPRAZolam Duanne Moron) 0.25 MG tablet Take 0.25 mg by mouth 2 (two) times  daily.    Historical Provider, MD  aspirin EC 81 MG tablet Take 81 mg by mouth daily.    Historical Provider, MD  Calcium Carbonate-Vitamin D (CALCIUM-D) 600-400 MG-UNIT TABS Take 1 tablet by mouth daily.    Historical Provider, MD  cefdinir (OMNICEF) 300 MG capsule Take 1 capsule (300 mg total) by mouth 2 (two) times daily. 05/05/15   Epifanio Lesches, MD  denosumab (PROLIA) 60 MG/ML SOLN injection Inject 60 mg into the skin every 6 (six) months. Administer in upper arm, thigh, or abdomen    Historical Provider, MD  diphenhydrAMINE (BENADRYL) 25 mg capsule Take 25 mg by mouth 2 (two) times daily.    Historical Provider, MD  donepezil (ARICEPT) 10 MG tablet Take 10 mg by mouth daily.    Historical Provider, MD  FLUoxetine (PROZAC) 10 MG capsule Take 10 mg by mouth 2 (two) times daily.    Historical Provider, MD  furosemide (LASIX) 20 MG tablet Take 20 mg by mouth daily.    Historical Provider, MD  Multiple Vitamins-Minerals (MULTIVITAMIN PO) Take 1 tablet by mouth daily.    Historical Provider, MD      VITAL SIGNS:  Blood pressure 113/62, pulse 81, temperature 98.7 F (37.1 C), temperature source Oral, resp. rate 16, height 5' 3"  (1.6 m), weight 52.164 kg (115 lb), SpO2 99 %.  PHYSICAL EXAMINATION:  GENERAL:  81 y.o.-year-old patient lying in the  bed with no acute distr unable to respond to the questions.: Pupils equal, round, reactive to light and accommodation. No scleral icterus. Extraocular muscles intact.  HEENT: Head atraumatic, normocephalic. Oropharynx and nasopharynx clear. mucosa is clinically dry.   NECK:  Supple, no jugular venous distention. No thyroid enlargement, no tenderness.  LUNGS: Normal breath sounds bilaterally, no wheezing, rales,rhonchi or crepitation. No use of accessory muscles of respiration.  CARDIOVASCULAR: S1, S2 normal. No murmurs, rubs, or gallops.  ABDOMEN: Soft, nontender, nondistended. Bowel sounds present. No organomegaly or mass.  EXTREMITIES: No pedal  edema, cyanosis, or clubbing.  NEUROLOGIC: Cranial nerves II through XII are intact. Muscle strength 5/5 in all extremities. Sensation intact. Gait not checked.  PSYCHIATRIC: The patient is alert and oriented x 3.  SKIN: No obvious rash, lesion, or ulcer.   LABORATORY PANEL:   CBC  Recent Labs Lab 06/27/15 1459  WBC 39.1*  HGB 9.9*  HCT 32.3*  PLT 680*   ------------------------------------------------------------------------------------------------------------------  Chemistries   Recent Labs Lab 06/27/15 1459  NA 161*  K 3.8  CL 119*  CO2 29  GLUCOSE 131*  BUN 128*  CREATININE 3.69*  CALCIUM 12.6*  AST 43*  ALT 37  ALKPHOS 100  BILITOT 0.5   ------------------------------------------------------------------------------------------------------------------  Cardiac Enzymes  Recent Labs Lab 06/27/15 1459  TROPONINI 0.07*   ------------------------------------------------------------------------------------------------------------------  RADIOLOGY:  Ct Head Wo Contrast  06/27/2015   CLINICAL DATA:  Altered mental status.  EXAM: CT HEAD WITHOUT CONTRAST  TECHNIQUE: Contiguous axial images were obtained from the base of the skull through the vertex without intravenous contrast.  COMPARISON:  05/01/2015  FINDINGS: There is atrophy and diffuse chronic microvascular changes throughout the deep white matter with associated ventriculomegaly. No acute intracranial abnormality. Specifically, no hemorrhage, hydrocephalus, mass lesion, acute infarction, or significant intracranial injury. No acute calvarial abnormality. Visualized paranasal sinuses and mastoids clear. Orbital soft tissues unremarkable.  IMPRESSION: No acute intracranial abnormality.  Atrophy, chronic microvascular disease.   Electronically Signed   By: Rolm Baptise M.D.   On: 06/27/2015 17:00   Dg Chest Portable 1 View  06/27/2015   CLINICAL DATA:  Weakness, unresponsive  EXAM: PORTABLE CHEST 1 VIEW   COMPARISON:  05/01/2015  FINDINGS: Chronic interstitial markings. Mild left basilar atelectasis/scarring. No focal consolidation. No pleural effusion or pneumothorax.  The heart is normal in size.  IMPRESSION: No evidence of acute cardiopulmonary disease.   Electronically Signed   By: Julian Hy M.D.   On: 06/27/2015 15:32    EKG:   Orders placed or performed during the hospital encounter of 06/27/15  . ED EKG  . ED EKG    IMPRESSION AND PLAN:   [age]-year-old female patient with severe Alzheimer's dementia comes in with poor by mouth intake for 3 days found to have acute renal failure, hypernatremia, severe leukocytosis.  1. Acute renal failure secondary to ATN coming from poor by mouth intake: Continue IV hydration normal saline at 100 cc an hour, check a met B every 8 hours, check renal ultrasound, avoid nephrotoxic agents, insert Foley and monitor urine output . 2.Severe hypernatremia due to his poor by mouth intake continue IV hydration and check met B every 8 hours, check a serum and urine osmolality.  #3. Leukocytosis likely due to UTI: She just recovered from klebsiella UTI I ,Continue Rocephin, follow urine cultures.  4.code status is DO NOT RESUSCITATE  Palliative care consulted, patient may be appropriate for hospice All the records are reviewed and case discussed with ED  provider. Management plans discussed with the patient, family and they are in agreement.  CODE STATUS: DO NOT RESUSCITATE  TOTAL TIME TAKING CARE OF THIS PATIENT: 79mnutes.    KEpifanio LeschesM.D on 06/27/2015 at 5:09 PM  Between 7am to 6pm - Pager - [phone]  After 6pm go to [email or web address] - password [email or web address]  Office  [phone] CC: Primary care physician; MLenard Simmer MD

## 2015-06-27 NOTE — ED Notes (Signed)
Pt here via ems from home, caregiver states that she has declined for the past week, pt has had minimal verbal conversation and isn't eating or drinking, daughter is in [place]

## 2015-06-27 NOTE — ED Notes (Signed)
Urine collected using in and out cath. Patient tolerated well. Fluids started, EKG completed.

## 2015-06-27 NOTE — ED Provider Notes (Signed)
[facility] Emergency Department Cynthia Robles Note  ____________________________________________  Time seen: 3 PM  I have reviewed the triage vital signs and the nursing notes.   HISTORY  Chief Complaint Failure to thrive  Patient nonverbal. History is per her caregiver  HPI Cynthia Robles is a 81 y.o. female with a history of Alzheimer's who has 24 7 caregivers at home. Her caregiver reports steady decline over the last several months but this has increased over the last 2 weeks. Patient is no longer speaking or communicating. Over the last week she has not wanted to take in any thing by mouth. Caregiver reports strong odor to the urine but no fevers. Patient has had a urinary tract infection in the past and was admitted for this. Patient is a DO NOT RESUSCITATE. At this time the daughter is not available.     Past Medical History  Diagnosis Date  . Anxiety   . Alzheimer disease   . Dementia   . High cholesterol   . Alzheimer disease   . Osteoporosis     Patient Active Problem List   Diagnosis Date Noted  . Pressure ulcer 05/05/2015  . Sepsis 05/01/2015    No past surgical history on file.  Current Outpatient Rx  Name  Route  Sig  Dispense  Refill  . acetaminophen (TYLENOL) 500 MG tablet   Oral   Take 500 mg by mouth 2 (two) times daily.          Marland Kitchen ALPRAZolam (XANAX) 0.25 MG tablet   Oral   Take 0.25 mg by mouth 2 (two) times daily.         Marland Kitchen aspirin EC 81 MG tablet   Oral   Take 81 mg by mouth daily.         . Calcium Carbonate-Vitamin D (CALCIUM-D) 600-400 MG-UNIT TABS   Oral   Take 1 tablet by mouth daily.         . cefdinir (OMNICEF) 300 MG capsule   Oral   Take 1 capsule (300 mg total) by mouth 2 (two) times daily.   20 capsule   0   . denosumab (PROLIA) 60 MG/ML SOLN injection   Subcutaneous   Inject 60 mg into the skin every 6 (six) months. Administer in upper arm, thigh, or abdomen         . diphenhydrAMINE  (BENADRYL) 25 mg capsule   Oral   Take 25 mg by mouth 2 (two) times daily.         Marland Kitchen donepezil (ARICEPT) 10 MG tablet   Oral   Take 10 mg by mouth daily.         Marland Kitchen FLUoxetine (PROZAC) 10 MG capsule   Oral   Take 10 mg by mouth 2 (two) times daily.         . furosemide (LASIX) 20 MG tablet   Oral   Take 20 mg by mouth daily.         . Multiple Vitamins-Minerals (MULTIVITAMIN PO)   Oral   Take 1 tablet by mouth daily.           Allergies Flagyl and Levaquin  No family history on file.  Social History Social History  Substance Use Topics  . Smoking status: Never Smoker   . Smokeless tobacco: Never Used  . Alcohol Use: No    Review of Systems per caregiver  Constitutional: Negative for fever. GU: Foul-smelling urine Abdominal: No diarrhea or vomiting Skin: No rash  per caregiver   Level V caveat: Patient is nonverbal and unresponsive  ____________________________________________   PHYSICAL EXAM:  VITAL SIGNS: ED Triage Vitals  Enc Vitals Group     BP 06/27/15 1450 110/67 mmHg     Pulse Rate 06/27/15 1450 89     Resp 06/27/15 1450 16     Temp 06/27/15 1450 98.7 F (37.1 C)     Temp Source 06/27/15 1450 Oral     SpO2 06/27/15 1450 98 %     Weight 06/27/15 1450 115 lb (52.164 kg)     Height 06/27/15 1450  (1.6 m)     Head Cir --      Peak Flow --      Pain Score --      Pain Loc --      Pain Edu? --      Excl. in GC? --      Constitutional: Unresponsive to voice. Cachectic Eyes: Conjunctivae are normal.  ENT   Head: Normocephalic and atraumatic.   Mouth/Throat: Mucous membranes are dry Cardiovascular: Normal rate, regular rhythm. Normal and symmetric distal pulses are present in all extremities Respiratory: Normal respiratory effort without tachypnea nor retractions. Breath sounds are clear and equal bilaterally.  Gastrointestinal: Soft and non-tender in all quadrants. No distention.  Genitourinary: deferred Musculoskeletal:   No lower extremity tenderness nor edema. Neurologic:  Neurologic exam extremity limited given patient's unresponsiveness Skin:  Skin is warm, dry and No rash noted.   ____________________________________________    LABS (pertinent positives/negatives)  Labs Reviewed  CBC - Abnormal; Notable for the following:    WBC 39.1 (*)    Hemoglobin 9.9 (*)    HCT 32.3 (*)    MCH 25.7 (*)    MCHC 30.7 (*)    RDW 15.9 (*)    Platelets 680 (*)    All other components within normal limits  COMPREHENSIVE METABOLIC PANEL - Abnormal; Notable for the following:    Sodium 161 (*)    Chloride 119 (*)    Glucose, Bld 131 (*)    BUN 128 (*)    Creatinine, Ser 3.69 (*)    Calcium 12.6 (*)    Albumin 2.7 (*)    AST 43 (*)    GFR calc non Af Amer 11 (*)    GFR calc Af Amer 12 (*)    All other components within normal limits  URINALYSIS COMPLETEWITH MICROSCOPIC ([facility] ONLY) - Abnormal; Notable for the following:    Color, Urine YELLOW (*)    APPearance HAZY (*)    Hgb urine dipstick 2+ (*)    Protein, ur 30 (*)    Leukocytes, UA 1+ (*)    Bacteria, UA RARE (*)    Squamous Epithelial / LPF 0-5 (*)    All other components within normal limits  TROPONIN I - Abnormal; Notable for the following:    Troponin I 0.07 (*)    All other components within normal limits  CULTURE, BLOOD (ROUTINE X 2)  CULTURE, BLOOD (ROUTINE X 2)  URINE CULTURE  LACTIC ACID, PLASMA  LACTIC ACID, PLASMA    ____________________________________________   EKG  ED ECG REPORT I, Jene Every, the attending physician, personally viewed and interpreted this ECG.   Date: 06/27/2015  EKG Time: 3:21 PM  Rate: 82  Rhythm: normal sinus rhythm  Axis: Normal  Intervals:none  ST&T Change: Nonspecific ST changes   ____________________________________________    RADIOLOGY I have personally reviewed any xrays that were ordered on  this patient: Chest x-ray  unremarkable  ____________________________________________   PROCEDURES  Procedure(s) performed: none  Critical Care performed: none  ____________________________________________   INITIAL IMPRESSION / ASSESSMENT AND PLAN / ED COURSE  Pertinent labs & imaging results that were available during my care of the patient were reviewed by me and considered in my medical decision making (see chart for details).  Patient with history of Alzheimer's disease with reported steady decline over the last 2 weeks has not been to me indicatingor taking in by mouth's. I suspect this is age-related decline/failure to thrive. She is a DO NOT RESUSCITATE. Certainly she is dehydrated. We will obtain labs urine chest x-ray and give fluids while we attempt to discuss with family they would like to proceed. Patient is likely a good candidate for hospice  ----------------------------------------- 3:46 PM on 06/27/2015 -----------------------------------------  Patient's white blood cell count is 39.1. Her chest x-ray is clear. She maintains normal vital signs. Suspect severe urinary tract infection. We will start Rocephin after getting blood cultures and lactic acid. We will continue fluid resuscitation.  ----------------------------------------- 4:52 PM on 06/27/2015 -----------------------------------------  Patient with lab values consistent with severe dehydration including hypernatremia and elevated BUN, and significant elevated creatinine. We will admit the patient to the internal medicine service for further evaluation  ____________________________________________   FINAL CLINICAL IMPRESSION(S) / ED DIAGNOSES  Final diagnoses:  Acute hypernatremia  Acute renal failure, unspecified acute renal failure type  Dehydration  Failure to thrive in adult     Jene Every, MD 06/27/15 1653

## 2015-06-28 ENCOUNTER — Encounter: Payer: Self-pay | Admitting: *Deleted

## 2015-06-28 LAB — COMPREHENSIVE METABOLIC PANEL
ALT: 33 U/L (ref 14–54)
ANION GAP: 8 (ref 5–15)
AST: 32 U/L (ref 15–41)
Albumin: 2.3 g/dL — ABNORMAL LOW (ref 3.5–5.0)
Alkaline Phosphatase: 92 U/L (ref 38–126)
BUN: 113 mg/dL — ABNORMAL HIGH (ref 6–20)
CHLORIDE: 126 mmol/L — AB (ref 101–111)
CO2: 28 mmol/L (ref 22–32)
CREATININE: 2.87 mg/dL — AB (ref 0.44–1.00)
Calcium: 10.9 mg/dL — ABNORMAL HIGH (ref 8.9–10.3)
GFR, EST AFRICAN AMERICAN: 17 mL/min — AB (ref >60)
GFR, EST NON AFRICAN AMERICAN: 14 mL/min — AB (ref >60)
Glucose, Bld: 122 mg/dL — ABNORMAL HIGH (ref 65–99)
POTASSIUM: 3.1 mmol/L — AB (ref 3.5–5.1)
SODIUM: 162 mmol/L — AB (ref 135–145)
Total Bilirubin: 0.3 mg/dL (ref 0.3–1.2)
Total Protein: 7.1 g/dL (ref 6.5–8.1)

## 2015-06-28 LAB — OSMOLALITY, URINE: OSMOLALITY UR: 502 mosm/kg (ref 300–900)

## 2015-06-28 LAB — BASIC METABOLIC PANEL
ANION GAP: 8 (ref 5–15)
Anion gap: 8 (ref 5–15)
BUN: 121 mg/dL — ABNORMAL HIGH (ref 6–20)
BUN: 121 mg/dL — AB (ref 6–20)
CALCIUM: 11.1 mg/dL — AB (ref 8.9–10.3)
CHLORIDE: 125 mmol/L — AB (ref 101–111)
CO2: 29 mmol/L (ref 22–32)
CO2: 26 mmol/L (ref 22–32)
CREATININE: 2.98 mg/dL — AB (ref 0.44–1.00)
CREATININE: 2.88 mg/dL — AB (ref 0.44–1.00)
Calcium: 10.7 mg/dL — ABNORMAL HIGH (ref 8.9–10.3)
Chloride: 128 mmol/L — ABNORMAL HIGH (ref 101–111)
GFR calc non Af Amer: 14 mL/min — ABNORMAL LOW (ref >60)
GFR, EST AFRICAN AMERICAN: 16 mL/min — AB (ref >60)
GFR, EST AFRICAN AMERICAN: 17 mL/min — AB (ref >60)
GFR, EST NON AFRICAN AMERICAN: 14 mL/min — AB (ref >60)
Glucose, Bld: 121 mg/dL — ABNORMAL HIGH (ref 65–99)
Glucose, Bld: 144 mg/dL — ABNORMAL HIGH (ref 65–99)
POTASSIUM: 3.1 mmol/L — AB (ref 3.5–5.1)
Potassium: 3.2 mmol/L — ABNORMAL LOW (ref 3.5–5.1)
SODIUM: 162 mmol/L — AB (ref 135–145)
SODIUM: 162 mmol/L — AB (ref 135–145)

## 2015-06-28 MED ORDER — DEXTROSE 5 % IV SOLN
INTRAVENOUS | Status: DC
  Administered 2015-06-28 – 2015-06-29 (×4): via INTRAVENOUS

## 2015-06-28 MED ORDER — DEXTROSE 5 % IV SOLN
INTRAVENOUS | Status: DC
  Administered 2015-06-28: 03:00:00 via INTRAVENOUS

## 2015-06-28 MED ORDER — DEXTROSE 5 % IV SOLN: INTRAVENOUS | Status: DC

## 2015-06-28 MED ORDER — DEXTROSE 5 % IV SOLN
1.0000 g | INTRAVENOUS | Status: DC
  Administered 2015-06-28 – 2015-06-29 (×2): 1 g via INTRAVENOUS
  Filled 2015-06-28 (×2): qty 10

## 2015-06-28 NOTE — Clinical Social Work Note (Signed)
Clinical Social Work Assessment  Patient Details  Name: Cynthia Robles MRN: [id number] Date of Birth: 06/20/1931  Date of referral:  06/28/15               Reason for consult:  Abuse/Neglect                Permission sought to share information with:   (pt was not awake during assessment) Permission granted to share information::  No  Name::        Agency::     Relationship::     Contact Information:     Housing/Transportation Living arrangements for the past 2 months:  Single Family Home Source of Information:  Adult Children, Other (Comment Required) (sibling) Patient Interpreter Needed:  None Criminal Activity/Legal Involvement Pertinent to Current Situation/Hospitalization:  No - Comment as needed Significant Relationships:  Adult Children, Siblings Lives with:  Other (Comment) (adult care givers) Do you feel safe going back to the place where you live?    Need for family participation in patient care:  Yes (Comment)  Care giving concerns:  CSW was notified about pt because of deep pressure ulcers.  Family was interested in how to care for wounds.   Social Worker assessment / plan:  CSW spoke to pt's daughter and sister in the room.  (Daugher Jasmine December [phone] and sister Princella Ion).  Per pt's family the pressure ulcers started at pt's previous hospital visit in July.  They have been trying to treat them with the home health aids.  First they used neosporin, then they were switched to Derma Wound.  This was dressed 2x a day at home per family.  Per pt's family pt's eating has decreased in the last week.  They have tried to get her to eat as much as they can, but pt does not always want to eat.  Also per pt's family pt is brought to the bathroom for toileting.  SHe is not left if her own waste at home.  She is also cleaned regularly.  CSW does not currently suspect any neglect or abuse.  Will continue to follow.  CSW also asked about possible facility placement.  They are  currently refusing and stated that they would prefer to take pt home.  Employment status:  Disabled (Comment on whether or not currently receiving Disability), Retired Health and safety inspector:    PT Recommendations:    Information / Referral to community resources:     Patient/Family's Response to care:  Pt's family thanked CSW for speaking with them about pt's care.    Patient/Family's Understanding of and Emotional Response to Diagnosis, Current Treatment, and Prognosis: pt's family verbalized their understanding of why CSW was there.    Emotional Assessment Appearance:  Appears older than stated age Attitude/Demeanor/Rapport:  Unable to Assess Affect (typically observed):  Unable to Assess Orientation:    Alcohol / Substance use:  Never Used Psych involvement (Current and /or in the community):  No (Comment)  Discharge Needs  Concerns to be addressed:  Home Safety Concerns Readmission within the last 30 days:  No Current discharge risk:  Physical Impairment Barriers to Discharge:  No Barriers Identified   Chauncy Passy, LCSW 06/28/2015, 3:51 PM

## 2015-06-28 NOTE — Progress Notes (Signed)
[facility] - [place] at [facility]   PATIENT NAME: Cynthia Robles    MR#:  [id number]  DATE OF BIRTH:  [birth year]  SUBJECTIVE:  CHIEF COMPLAINT:   Chief Complaint  Patient presents with  . Loss of Consciousness  Still drowzy and non verbal. Afebrile.  REVIEW OF SYSTEMS:    Review of Systems  Unable to perform ROS: acuity of condition      DRUG ALLERGIES:   Allergies  Allergen Reactions  . Flagyl [Metronidazole] Other (See Comments)    Reaction:  Unknown   . Levaquin [Levofloxacin In D5w] Hives    VITALS:  Blood pressure 107/47, pulse 91, temperature 98.1 F (36.7 C), temperature source Oral, resp. rate 20, height  (1.6 m), weight 46.494 kg (102 lb 8 oz), SpO2 89 %.  PHYSICAL EXAMINATION:   Physical Exam  GENERAL:  81 y.o.-year-old patient lying in the bed with no acute distress. Looks pale and critically ill EYES: Pupils equal, round, reactive to light and accommodation. No scleral icterus. Extraocular muscles intact.  HEENT: Head atraumatic, normocephalic. Oropharynx and nasopharynx clear.  NECK:  Supple, no jugular venous distention. No thyroid enlargement, no tenderness.  LUNGS: Normal breath sounds bilaterally, no wheezing, rales, rhonchi. No use of accessory muscles of respiration.  CARDIOVASCULAR: S1, S2 normal. No murmurs, rubs, or gallops.  ABDOMEN: Soft, nontender, nondistended. Bowel sounds present. No organomegaly or mass.  EXTREMITIES: No cyanosis, clubbing or edema b/l.    NEUROLOGIC: Cranial nerves II through XII are intact. No focal Motor or sensory deficits b/l.   PSYCHIATRIC: The patient is nonverbal and drowzy. SKIN: No obvious rash, lesion, or ulcer.    LABORATORY PANEL:   CBC  Recent Labs Lab 06/27/15 1459  WBC 39.1*  HGB 9.9*  HCT 32.3*  PLT 680*   ------------------------------------------------------------------------------------------------------------------  Chemistries   Recent Labs Lab  06/28/15 0524 06/28/15 0917  NA 162* 162*  K 3.1* 3.1*  CL 126* 128*  CO2 28 26  GLUCOSE 122* 144*  BUN 113* 121*  CREATININE 2.87* 2.88*  CALCIUM 10.9* 10.7*  AST 32  --   ALT 33  --   ALKPHOS 92  --   BILITOT 0.3  --    ------------------------------------------------------------------------------------------------------------------  Cardiac Enzymes  Recent Labs Lab 06/27/15 1459  TROPONINI 0.07*   ------------------------------------------------------------------------------------------------------------------  RADIOLOGY:  Ct Head Wo Contrast  06/27/2015   CLINICAL DATA:  Altered mental status.  EXAM: CT HEAD WITHOUT CONTRAST  TECHNIQUE: Contiguous axial images were obtained from the base of the skull through the vertex without intravenous contrast.  COMPARISON:  05/01/2015  FINDINGS: There is atrophy and diffuse chronic microvascular changes throughout the deep white matter with associated ventriculomegaly. No acute intracranial abnormality. Specifically, no hemorrhage, hydrocephalus, mass lesion, acute infarction, or significant intracranial injury. No acute calvarial abnormality. Visualized paranasal sinuses and mastoids clear. Orbital soft tissues unremarkable.  IMPRESSION: No acute intracranial abnormality.  Atrophy, chronic microvascular disease.   Electronically Signed   By: Charlett Nose M.D.   On: 06/27/2015 17:00   US Renal  06/27/2015   CLINICAL DATA:  Renal failure  EXAM: RENAL / URINARY TRACT ULTRASOUND COMPLETE  COMPARISON:  None.  FINDINGS: Right Kidney:  Length: 9.3 cm. Echogenicity within normal limits. No mass or hydronephrosis visualized.  Left Kidney:  Length: 9.0 cm. 1.4 cm hypoechoic area with increased through transmission is noted. It does not fulfill strict criteria for a simple cyst.  Bladder:  Well distended. There appears to be changes of  septation within the bladder.  IMPRESSION: Hypoechoic area within the left kidney which may simply represent a cyst  although does not fulfill simple ultrasound criteria.  Suggestion of septation within the bladder.  Clinical correlation is recommended. The need for further followup can be determined on a clinical basis.   Electronically Signed   By: Alcide Clever M.D.   On: 06/27/2015 18:26   Dg Chest Portable 1 View  06/27/2015   CLINICAL DATA:  Weakness, unresponsive  EXAM: PORTABLE CHEST 1 VIEW  COMPARISON:  05/01/2015  FINDINGS: Chronic interstitial markings. Mild left basilar atelectasis/scarring. No focal consolidation. No pleural effusion or pneumothorax.  The heart is normal in size.  IMPRESSION: No evidence of acute cardiopulmonary disease.   Electronically Signed   By: Charline Bills M.D.   On: 06/27/2015 15:32     ASSESSMENT AND PLAN:   [age]-year-old female patient with severe Alzheimer's dementia comes in with poor by mouth intake for 3 days found to have acute renal failure, hypernatremia, severe leukocytosis.  * Acute renal failure secondary to severe dehydration On IVF. Monitor I/O and Bun/cr. Will need to discuss regarding HD if worse. . * Severe hypernatremia with dehydration Increase IVF rate.  * UTI with sepsis  * Dementia with poor appetite - worsening No PEG per HCPOA at bedside.  * Left kidney cyst OP workup  Discussed at length. DNR. No PEG. May have to consider comfort measures if no improvement by tomorrow   All the records are reviewed and case discussed with Care Management/Social Workerr. Management plans discussed with the patient, family and they are in agreement.  CODE STATUS: DNR  DVT Prophylaxis: SCDs  TOTAL TIME TAKING CARE OF THIS PATIENT: 35 minutes.   POSSIBLE D/C IN 2-3 DAYS, DEPENDING ON CLINICAL CONDITION.   Milagros Loll R M.D on 06/28/2015 at 12:01 PM  Between 7am to 6pm - Pager - [phone]  After 6pm go to [email or web address] - password [email or web address]  [facility]  Office  [phone]  CC: Primary care physician;  Alan Mulder, MD    Note: This dictation was prepared with Dragon dictation along with smaller phrase technology. Any transcriptional errors that result from this process are unintentional.

## 2015-06-28 NOTE — Progress Notes (Signed)
Called Dr. Sheryle Hail earlier about pt sodium level 161.

## 2015-06-29 DIAGNOSIS — G9340 Encephalopathy, unspecified: Secondary | ICD-10-CM

## 2015-06-29 DIAGNOSIS — L89629 Pressure ulcer of left heel, unspecified stage: Secondary | ICD-10-CM

## 2015-06-29 DIAGNOSIS — D72829 Elevated white blood cell count, unspecified: Secondary | ICD-10-CM

## 2015-06-29 DIAGNOSIS — A419 Sepsis, unspecified organism: Principal | ICD-10-CM

## 2015-06-29 DIAGNOSIS — E870 Hyperosmolality and hypernatremia: Secondary | ICD-10-CM

## 2015-06-29 DIAGNOSIS — Z66 Do not resuscitate: Secondary | ICD-10-CM

## 2015-06-29 DIAGNOSIS — L89159 Pressure ulcer of sacral region, unspecified stage: Secondary | ICD-10-CM

## 2015-06-29 DIAGNOSIS — E860 Dehydration: Secondary | ICD-10-CM

## 2015-06-29 DIAGNOSIS — M818 Other osteoporosis without current pathological fracture: Secondary | ICD-10-CM

## 2015-06-29 DIAGNOSIS — E785 Hyperlipidemia, unspecified: Secondary | ICD-10-CM

## 2015-06-29 DIAGNOSIS — G309 Alzheimer's disease, unspecified: Secondary | ICD-10-CM

## 2015-06-29 DIAGNOSIS — N179 Acute kidney failure, unspecified: Secondary | ICD-10-CM

## 2015-06-29 DIAGNOSIS — N390 Urinary tract infection, site not specified: Secondary | ICD-10-CM

## 2015-06-29 DIAGNOSIS — F419 Anxiety disorder, unspecified: Secondary | ICD-10-CM

## 2015-06-29 DIAGNOSIS — B961 Klebsiella pneumoniae [K. pneumoniae] as the cause of diseases classified elsewhere: Secondary | ICD-10-CM

## 2015-06-29 DIAGNOSIS — Z515 Encounter for palliative care: Secondary | ICD-10-CM

## 2015-06-29 DIAGNOSIS — L89619 Pressure ulcer of right heel, unspecified stage: Secondary | ICD-10-CM

## 2015-06-29 DIAGNOSIS — L89899 Pressure ulcer of other site, unspecified stage: Secondary | ICD-10-CM

## 2015-06-29 DIAGNOSIS — L89101 Pressure ulcer of unspecified part of back, stage 1: Secondary | ICD-10-CM

## 2015-06-29 DIAGNOSIS — F0280 Dementia in other diseases classified elsewhere without behavioral disturbance: Secondary | ICD-10-CM

## 2015-06-29 LAB — BASIC METABOLIC PANEL
Anion gap: 7 (ref 5–15)
Anion gap: 8 (ref 5–15)
BUN: 82 mg/dL — AB (ref 6–20)
BUN: 68 mg/dL — AB (ref 6–20)
CALCIUM: 9.6 mg/dL (ref 8.9–10.3)
CALCIUM: 9.3 mg/dL (ref 8.9–10.3)
CHLORIDE: 116 mmol/L — AB (ref 101–111)
CO2: 27 mmol/L (ref 22–32)
CO2: 24 mmol/L (ref 22–32)
CREATININE: 2.24 mg/dL — AB (ref 0.44–1.00)
CREATININE: 2.01 mg/dL — AB (ref 0.44–1.00)
Chloride: 114 mmol/L — ABNORMAL HIGH (ref 101–111)
GFR, EST AFRICAN AMERICAN: 22 mL/min — AB (ref >60)
GFR, EST AFRICAN AMERICAN: 26 mL/min — AB (ref >60)
GFR, EST NON AFRICAN AMERICAN: 19 mL/min — AB (ref >60)
GFR, EST NON AFRICAN AMERICAN: 22 mL/min — AB (ref >60)
Glucose, Bld: 172 mg/dL — ABNORMAL HIGH (ref 65–99)
Glucose, Bld: 101 mg/dL — ABNORMAL HIGH (ref 65–99)
Potassium: 2.8 mmol/L — CL (ref 3.5–5.1)
Potassium: 3.8 mmol/L (ref 3.5–5.1)
SODIUM: 150 mmol/L — AB (ref 135–145)
SODIUM: 146 mmol/L — AB (ref 135–145)

## 2015-06-29 LAB — URINE CULTURE

## 2015-06-29 LAB — CBC WITH DIFFERENTIAL/PLATELET
Basophils Absolute: 0.1 10*3/uL (ref 0–0.1)
EOS ABS: 1.1 10*3/uL — AB (ref 0–0.7)
HCT: 24.8 % — ABNORMAL LOW (ref 35.0–47.0)
HEMOGLOBIN: 7.8 g/dL — AB (ref 12.0–16.0)
LYMPHS ABS: 3.3 10*3/uL (ref 1.0–3.6)
Lymphocytes Relative: 10 %
MCH: 26.1 pg (ref 26.0–34.0)
MCHC: 31.5 g/dL — AB (ref 32.0–36.0)
MCV: 82.8 fL (ref 80.0–100.0)
Monocytes Absolute: 1.5 10*3/uL — ABNORMAL HIGH (ref 0.2–0.9)
Neutro Abs: 26 10*3/uL — ABNORMAL HIGH (ref 1.4–6.5)
PLATELETS: 478 10*3/uL — AB (ref 150–440)
RBC: 3 MIL/uL — AB (ref 3.80–5.20)
RDW: 16.2 % — ABNORMAL HIGH (ref 11.5–14.5)
WBC: 31.9 10*3/uL — AB (ref 3.6–11.0)

## 2015-06-29 MED ORDER — DAKINS (1/4 STRENGTH) 0.125 % EX SOLN
Freq: Two times a day (BID) | CUTANEOUS | Status: DC
  Administered 2015-06-29 – 2015-07-01 (×5)
  Filled 2015-06-29 (×2): qty 473

## 2015-06-29 MED ORDER — POTASSIUM CL IN DEXTROSE 5% 20 MEQ/L IV SOLN
20.0000 meq | INTRAVENOUS | Status: DC
  Administered 2015-06-29 – 2015-07-01 (×4): 20 meq via INTRAVENOUS
  Filled 2015-06-29 (×6): qty 1000

## 2015-06-29 MED ORDER — VANCOMYCIN HCL IN DEXTROSE 750-5 MG/150ML-% IV SOLN
750.0000 mg | INTRAVENOUS | Status: DC
  Administered 2015-06-30: 750 mg via INTRAVENOUS
  Filled 2015-06-29: qty 150

## 2015-06-29 MED ORDER — VANCOMYCIN HCL IN DEXTROSE 750-5 MG/150ML-% IV SOLN
750.0000 mg | Freq: Once | INTRAVENOUS | Status: AC
  Administered 2015-06-29: 750 mg via INTRAVENOUS
  Filled 2015-06-29: qty 150

## 2015-06-29 MED ORDER — DEXTROSE 5 % IV SOLN
2.0000 g | INTRAVENOUS | Status: DC
  Administered 2015-06-29: 2 g via INTRAVENOUS
  Filled 2015-06-29 (×2): qty 2

## 2015-06-29 MED ORDER — POTASSIUM CHLORIDE 10 MEQ/100ML IV SOLN
10.0000 meq | INTRAVENOUS | Status: AC
  Administered 2015-06-29 (×4): 10 meq via INTRAVENOUS
  Filled 2015-06-29 (×4): qty 100

## 2015-06-29 NOTE — Progress Notes (Signed)
Palliative Care Update   Pt's chart reviewed. Exam performed. Spoke with Dr. Elpidio Anis and also with SLP therapist.   Family had just stepped outside of room.  Full note to follow.  Suan Halter, MD

## 2015-06-29 NOTE — Consult Note (Signed)
WOC wound consult note Reason for Consult: Unstageable pressure injuries on heels, sacrum/coccyx, left trochanter, left elbow and thoracic spine.  Caregiver at bedside states she had a sudden decrease in intake a few days ago.  Medical notes with HCPOA indicate family does not wish for PEG tube.  Ongoing coordination with family is underway.  Wound type:Untastageable pressure injury Pressure Ulcer POA: Yes Measurement:Left heel 100% necrotic tissue 2 cm x 2 cm x 0.2 cm (unable to visualize wound depth due to slough) Right heel 3 cm x 3.2 cm x 0.2 cm 100% slough Left trochanter Stage III pressure ulcer 3 cm x 3.2cm x 0.1 cm with 25% yellow slough Thoracic spine 4 cm x 3.1 cm x 0.2 cm 100% pink and moist Left elbow is nonblanchable redness 2 cm x 2 cm  Coccyx and sacrum: 26cm x 16cm 100% foul smelling gray slough to wound bed. Unable to visualize wound bed.Unstageable Wound bed:see above Drainage (amount, consistency, odor) Moderate serosanguinous drainage.  Foul necrotic odor to heels and sacrum.  Periwound:Intact Dressing procedure/placement/frequency:Cleanse ulcerswith NS and pat gently dry.  Apply silicone border foam to thoracic spine, left elbow and left trochanter wounds.  Change every 3 days and PRN soilage.   After cleansing, apply Dakin's moistened gauze to wound bed to bilateral heel wounds and sacral/coccyx .  Cover with 4x4 gauze and ABD pad.  Change each shift.  Prevalon boots to bilateral heels Will order mattress replacement with low air loss feature. No disposable incontinence products.  Brief or underpads.   Will not follow at this time.  Please re-consult if needed.  Maple Hudson RN BSN CWON Pager [phone]

## 2015-06-29 NOTE — Progress Notes (Signed)
ANTIBIOTIC CONSULT NOTE - INITIAL  Pharmacy Consult for Vancomycin Indication: rule out sepsis  Allergies  Allergen Reactions  . Flagyl [Metronidazole] Other (See Comments)    Reaction:  Unknown   . Levaquin [Levofloxacin In D5w] Hives    Patient Measurements: Height:  (160 cm) Weight: 102 lb 8 oz (46.494 kg) IBW/kg (Calculated) : 52.4   Vital Signs: Temp: 98.5 F (36.9 C) (09/26 1252) Temp Source: Oral (09/26 1252) BP: 100/68 mmHg (09/26 1252) Pulse Rate: 75 (09/26 1252) Intake/Output from previous day: 09/25 0701 - 09/26 0700 In: 1217 [I.V.:1217] Out: 0  Intake/Output from this shift:    Labs:  Recent Labs  06/27/15 1459  06/28/15 0524 06/28/15 0917 06/29/15 0630  WBC 39.1*  --   --   --  31.9*  HGB 9.9*  --   --   --  7.8*  PLT 680*  --   --   --  478*  CREATININE 3.69*  < > 2.87* 2.88* 2.24*  < > = values in this interval not displayed. Estimated Creatinine Clearance: 14.5 mL/min (by C-G formula based on Cr of 2.24). No results for input(s): VANCOTROUGH, VANCOPEAK, VANCORANDOM, GENTTROUGH, GENTPEAK, GENTRANDOM, TOBRATROUGH, TOBRAPEAK, TOBRARND, AMIKACINPEAK, AMIKACINTROU, AMIKACIN in the last 72 hours.   Microbiology: Recent Results (from the past 720 hour(s))  Urine culture     Status: None   Collection Time: 06/27/15  3:34 PM  Result Value Ref Range Status   Specimen Description URINE, RANDOM  Final   Special Requests NONE  Final   Culture 50,000 COLONIES/mL PSEUDOMONAS AERUGINOSA  Final   Report Status 06/29/2015 FINAL  Final   Organism ID, Bacteria PSEUDOMONAS AERUGINOSA  Final      Susceptibility   Pseudomonas aeruginosa - MIC*    CEFTAZIDIME 4 SENSITIVE Sensitive     CIPROFLOXACIN <=0.25 INTERMEDIATE Intermediate     GENTAMICIN 4 SENSITIVE Sensitive     IMIPENEM 2 SENSITIVE Sensitive     PIP/TAZO Value in next row Sensitive      SENSITIVE<=4    * 50,000 COLONIES/mL PSEUDOMONAS AERUGINOSA  Blood culture (routine x 2)     Status: None  (Preliminary result)   Collection Time: 06/27/15  5:10 PM  Result Value Ref Range Status   Specimen Description BLOOD LEFT WRIST  Final   Special Requests BLOOD  Final   Culture  Setup Time   Final    GRAM POSITIVE COCCI IN CLUSTERS ANAEROBIC BOTTLE ONLY CRITICAL RESULT CALLED TO, READ BACK BY AND VERIFIED WITH: TONYA CRANFORD 06/29/15 1505 JGF.    Culture   Final    GRAM POSITIVE COCCI IN CLUSTERS IDENTIFICATION TO FOLLOW    Report Status PENDING  Incomplete  Blood culture (routine x 2)     Status: None (Preliminary result)   Collection Time: 06/27/15  5:10 PM  Result Value Ref Range Status   Specimen Description BLOOD LEFT WRIST  Final   Special Requests BLOOD  Final   Culture NO GROWTH 2 DAYS  Final   Report Status PENDING  Incomplete    Medical History: Past Medical History  Diagnosis Date  . Anxiety   . Alzheimer disease   . Dementia   . High cholesterol   . Alzheimer disease   . Osteoporosis     Medications:  Scheduled:  . cefTRIAXone (ROCEPHIN)  IV  1 g Intravenous Q24H  . donepezil  10 mg Oral Daily  . FLUoxetine  10 mg Oral BID  . potassium chloride  10 mEq  Intravenous Q1 Hr x 4  . sodium hypochlorite   Irrigation BID  . vancomycin  750 mg Intravenous Once  . [START ON 06/30/2015] vancomycin  750 mg Intravenous Q48H   Infusions:  . dextrose 5 % with KCl 20 mEq / L 20 mEq (06/29/15 1129)   Assessment: Pharmacy consulted to dose Vancomycin in an 81 yo female with 1/2 GPC in blood cultures.  Empiric treatment desired for bacteremia.   SCr: 2.24, est CrCl~14.5 mL/min, ke: 0.016, t1/2: 43 h, Vd: 32.6 L  Goal of Therapy:  Vancomycin trough level 15-20 mcg/ml  Plan:  Will start patient on Vancomycin 750 mg IV once with a stacked dose in ~28h.  Will then start patient on maintenance dosing of Vancomycin 750 mg IV q48h based on renal function.  Will schedule trough prior to dose on 9/29 (day 4 of therapy but not steady state) for monitoring purposes.  Measure  antibiotic drug levels at steady state Follow up culture results   Pharmacy will continue to follow.  Kaulder Zahner G 06/29/2015,4:44 PM

## 2015-06-29 NOTE — Care Management Important Message (Signed)
Important Message  Patient Details  Name: Cynthia Robles MRN: [id number] Date of Birth: [birth year]   Medicare Important Message Given:  Yes-second notification given    [name] 06/29/2015, 11:09 AM

## 2015-06-29 NOTE — Care Management (Signed)
Discussed patient care with attending Dr Elpidio Anis who stated that family would like palliative care consult. MD will contact palliative to discuss. Spoke with the POA Jasmine December [phone] to discuss plan. Daughter stated that patient does have 24 hour assistance in the home but that at this time she would like to see what type of assistance would be available.

## 2015-06-29 NOTE — Progress Notes (Signed)
Pt was placed on a SW alternate mattress today.

## 2015-06-29 NOTE — Progress Notes (Signed)
Speech Therapy Note: received order, reviewed chart notes and consulted NSG/MD re: pt's status today. Noted pt's Sodium level has decreased to 150 from 162 but still in the abnormal range. Pt remains lethargic and poorly responsive to verbal/tactile stim. Wound care addressing wounds. Dtr and chart notes indicated reduced po intake overall especially in the past week which could be related to the wounds and reason for her admission. Due to pt's lethargic presentation at this time, pt is at increased risk for aspiration w/ any oral intake. Rec. Continue current NPO status w/ oral care and strict aspiration precautions. ST services will f/u tomorrow w/ attempt at po trials. NSG and Dtr updated. MD agreed.

## 2015-06-29 NOTE — Progress Notes (Signed)
[facility] - [place] at [facility]   PATIENT NAME: Cynthia Robles    MR#:  [id number]  DATE OF BIRTH:  [birth year]  SUBJECTIVE:  CHIEF COMPLAINT:   Chief Complaint  Patient presents with  . Loss of Consciousness  Still drowzy and non verbal. Afebrile.  REVIEW OF SYSTEMS:    Review of Systems  Unable to perform ROS: acuity of condition      DRUG ALLERGIES:   Allergies  Allergen Reactions  . Flagyl [Metronidazole] Other (See Comments)    Reaction:  Unknown   . Levaquin [Levofloxacin In D5w] Hives    VITALS:  Blood pressure 107/47, pulse 83, temperature 99.8 F (37.7 C), temperature source Oral, resp. rate 20, height  (1.6 m), weight 46.494 kg (102 lb 8 oz), SpO2 96 %.  PHYSICAL EXAMINATION:   Physical Exam  GENERAL:  81 y.o.-year-old patient lying in the bed with no acute distress. Looks pale and critically ill EYES: Pupils equal, round, reactive to light and accommodation. No scleral icterus. Extraocular muscles intact.  HEENT: Head atraumatic, normocephalic. Oropharynx and nasopharynx clear.  NECK:  Supple, no jugular venous distention. No thyroid enlargement, no tenderness.  LUNGS: Normal breath sounds bilaterally, no wheezing, rales, rhonchi. No use of accessory muscles of respiration.  CARDIOVASCULAR: S1, S2 normal. No murmurs, rubs, or gallops.  ABDOMEN: Soft, nontender, nondistended. Bowel sounds present. No organomegaly or mass.  EXTREMITIES: No cyanosis, clubbing or edema b/l.    NEUROLOGIC: Cranial nerves II through XII are intact. No focal Motor or sensory deficits b/l.   PSYCHIATRIC: The patient is nonverbal and drowzy. SKIN: No obvious rash, lesion, or ulcer.    LABORATORY PANEL:   CBC  Recent Labs Lab 06/29/15 0630  WBC 31.9*  HGB 7.8*  HCT 24.8*  PLT 478*   ------------------------------------------------------------------------------------------------------------------  Chemistries   Recent Labs Lab  06/28/15 0524  06/29/15 0630  NA 162*  < > 150*  K 3.1*  < > 2.8*  CL 126*  < > 116*  CO2 28  < > 27  GLUCOSE 122*  < > 172*  BUN 113*  < > 82*  CREATININE 2.87*  < > 2.24*  CALCIUM 10.9*  < > 9.6  AST 32  --   --   ALT 33  --   --   ALKPHOS 92  --   --   BILITOT 0.3  --   --   < > = values in this interval not displayed. ------------------------------------------------------------------------------------------------------------------  Cardiac Enzymes  Recent Labs Lab 06/27/15 1459  TROPONINI 0.07*   ------------------------------------------------------------------------------------------------------------------  RADIOLOGY:  Ct Head Wo Contrast  06/27/2015   CLINICAL DATA:  Altered mental status.  EXAM: CT HEAD WITHOUT CONTRAST  TECHNIQUE: Contiguous axial images were obtained from the base of the skull through the vertex without intravenous contrast.  COMPARISON:  05/01/2015  FINDINGS: There is atrophy and diffuse chronic microvascular changes throughout the deep white matter with associated ventriculomegaly. No acute intracranial abnormality. Specifically, no hemorrhage, hydrocephalus, mass lesion, acute infarction, or significant intracranial injury. No acute calvarial abnormality. Visualized paranasal sinuses and mastoids clear. Orbital soft tissues unremarkable.  IMPRESSION: No acute intracranial abnormality.  Atrophy, chronic microvascular disease.   Electronically Signed   By: Charlett Nose M.D.   On: 06/27/2015 17:00   US Renal  06/27/2015   CLINICAL DATA:  Renal failure  EXAM: RENAL / URINARY TRACT ULTRASOUND COMPLETE  COMPARISON:  None.  FINDINGS: Right Kidney:  Length: 9.3 cm. Echogenicity within  normal limits. No mass or hydronephrosis visualized.  Left Kidney:  Length: 9.0 cm. 1.4 cm hypoechoic area with increased through transmission is noted. It does not fulfill strict criteria for a simple cyst.  Bladder:  Well distended. There appears to be changes of septation within  the bladder.  IMPRESSION: Hypoechoic area within the left kidney which may simply represent a cyst although does not fulfill simple ultrasound criteria.  Suggestion of septation within the bladder.  Clinical correlation is recommended. The need for further followup can be determined on a clinical basis.   Electronically Signed   By: Alcide Clever M.D.   On: 06/27/2015 18:26   Dg Chest Portable 1 View  06/27/2015   CLINICAL DATA:  Weakness, unresponsive  EXAM: PORTABLE CHEST 1 VIEW  COMPARISON:  05/01/2015  FINDINGS: Chronic interstitial markings. Mild left basilar atelectasis/scarring. No focal consolidation. No pleural effusion or pneumothorax.  The heart is normal in size.  IMPRESSION: No evidence of acute cardiopulmonary disease.   Electronically Signed   By: Charline Bills M.D.   On: 06/27/2015 15:32     ASSESSMENT AND PLAN:   61 81-year-old female patient with severe Alzheimer's dementia comes in with poor by mouth intake for 3 days found to have acute renal failure, hypernatremia, severe leukocytosis.  * Acute renal failure secondary to severe dehydration On IVF. Monitor I/O and Bun/cr. Some improvement. . * Severe hypernatremia with dehydration On D5w IVF  * UTI with sepsis Cyst on renal US. Abscess.  * Dementia with poor appetite - worsening No PEG per HCPOA at bedside.  * Left kidney cyst OP workup  Discussed at length. DNR. No PEG. May have to consider comfort measures if no improvement.  Consult palliative care. Daughter wants to take patient home when able to discharge.   All the records are reviewed and case discussed with Care Management/Social Workerr. Management plans discussed with the patient, family and they are in agreement.  CODE STATUS: DNR  DVT Prophylaxis: SCDs  TOTAL TIME TAKING CARE OF THIS PATIENT: 35 minutes.   POSSIBLE D/C IN 2-3 DAYS, DEPENDING ON CLINICAL CONDITION.   Milagros Loll R M.D on 06/29/2015 at 12:24 PM  Between 7am to 6pm -  Pager - [phone]  After 6pm go to [email or web address] - password [email or web address]  [facility]  Office  [phone]  CC: Primary care physician; Alan Mulder, MD    Note: This dictation was prepared with Dragon dictation along with smaller phrase technology. Any transcriptional errors that result from this process are unintentional.

## 2015-06-30 DIAGNOSIS — E870 Hyperosmolality and hypernatremia: Secondary | ICD-10-CM | POA: Insufficient documentation

## 2015-06-30 LAB — CBC WITH DIFFERENTIAL/PLATELET
Basophils Absolute: 0 10*3/uL (ref 0–0.1)
Basophils Relative: 0 %
EOS ABS: 0.7 10*3/uL (ref 0–0.7)
Eosinophils Relative: 2 %
HEMATOCRIT: 26.7 % — AB (ref 35.0–47.0)
HEMOGLOBIN: 8.4 g/dL — AB (ref 12.0–16.0)
LYMPHS ABS: 2.3 10*3/uL (ref 1.0–3.6)
LYMPHS PCT: 7 %
MCH: 26.1 pg (ref 26.0–34.0)
MCHC: 31.5 g/dL — AB (ref 32.0–36.0)
MCV: 82.9 fL (ref 80.0–100.0)
MONOS PCT: 3 %
Monocytes Absolute: 0.9 10*3/uL (ref 0.2–0.9)
NEUTROS PCT: 88 %
Neutro Abs: 28.2 10*3/uL — ABNORMAL HIGH (ref 1.4–6.5)
Platelets: 460 10*3/uL — ABNORMAL HIGH (ref 150–440)
RBC: 3.23 MIL/uL — ABNORMAL LOW (ref 3.80–5.20)
RDW: 15.6 % — ABNORMAL HIGH (ref 11.5–14.5)
WBC: 32 10*3/uL — ABNORMAL HIGH (ref 3.6–11.0)

## 2015-06-30 LAB — BASIC METABOLIC PANEL
Anion gap: 8 (ref 5–15)
BUN: 54 mg/dL — ABNORMAL HIGH (ref 6–20)
CHLORIDE: 111 mmol/L (ref 101–111)
CO2: 23 mmol/L (ref 22–32)
CREATININE: 1.78 mg/dL — AB (ref 0.44–1.00)
Calcium: 8.8 mg/dL — ABNORMAL LOW (ref 8.9–10.3)
GFR calc non Af Amer: 26 mL/min — ABNORMAL LOW (ref >60)
GFR, EST AFRICAN AMERICAN: 30 mL/min — AB (ref >60)
Glucose, Bld: 131 mg/dL — ABNORMAL HIGH (ref 65–99)
POTASSIUM: 3.6 mmol/L (ref 3.5–5.1)
Sodium: 142 mmol/L (ref 135–145)

## 2015-06-30 MED ORDER — DEXTROSE 5 % IV SOLN
500.0000 mg | INTRAVENOUS | Status: DC
  Administered 2015-06-30: 500 mg via INTRAVENOUS
  Filled 2015-06-30 (×2): qty 0.5

## 2015-06-30 NOTE — Consult Note (Signed)
Palliative Medicine Inpatient Consult Note   Name: Cynthia Robles Date: 06/30/2015 MRN: [id number]  DOB: [birth year]  Referring Physician: Milagros Loll, MD  Palliative Care consult requested for this 81 y.o. female for goals of medical therapy in patient with encephalopathy thought to be due to hypernatremia from dehydration.  Pt has 24 hr / day caregivers in the home and they noticed that she was going downhill for the last 3 days.  She had decreased oral intake and increased confusion. She has decubiti of sacrum and family and aids had been taking care of that on their own w/o wound care or home health nursing.  Pts sodium was 161 at admission and WBC was 39K  IMPRESSION: 1.  Hypernatremia due to dehydration due to decreased oral intake which is due to UTI effects 2.  Severe leukocytosis likely due to uti (she has had klebsiella uti and is on Rocephin here) 3.  SEPSIS due to UTI with repeat UR CX pending (on Rocephin) PRESENT ON ADMISSION 4. Advanced Alzheimer's Dementia 5.  Acute Renal Flre due to ATN due to poor intake due to effects of UTI 6. Multiple decubiti : Stage 3 left trochanter 3 cm x 3.2 cm x 0.2 cm unstagable Left heel is 2 cm x 2 cx by 0.2 cm but necrotic tissue unstagable Right heel is 3 cm x 3 cm.2 cm by 0.2 cm unstageable Thoracic spine is 4 cm by 3.1 cm c -2. Cm pink and moist stage 1 Coccyx and sacrum 26 cm x 16 xm xfoul smelling gray slough and unstagable and very large  ---I have not seen this yet and will attempt to soon 7.  Left kidney cyst seen on renal US (OP work up IF desired)  Discussions and Plans:  Family had just stepped outside of room.  I have examined pt, talked with attending and SLP and nursing.  I see that pt is DNR and no feeding tube status.  It appears that pt has 24 hr aid care, but probably needs nursing care for wounds (as neosporin is not a decubitus care product etc) and Hospice certainly is appropriate in the home. Pt will be here another  day or so, and I may need to talk with family a bit later since the day is quite full with consults. I will try to talk with family today if I can, however.  We are waiting to see if pt wakes up a bit further with more correction of high sodium.      REVIEW OF SYSTEMS:  Patient is not able to provide ROS  SOCIAL HISTORY:  reports that she has never smoked. She has never used smokeless tobacco. She reports that she does not drink alcohol or use illicit drugs. Daughter, Jasmine December, at # [phone] and sister, Talbert Forest [phone]).  Pts pressure ulcers started in July in hospital and family was using Neosporin then Derma Wound.  Pt is brought to bathroom for toileting usually.  Has 24 hr care.  Family wants to keep her in the home and declines facility care.   LEGAL DOCUMENTS:   CODE STATUS: DNR  PAST MEDICAL HISTORY: Past Medical History  Diagnosis Date  . Anxiety   . Alzheimer disease   . Dementia   . High cholesterol   . Alzheimer disease   . Osteoporosis     PAST SURGICAL HISTORY: History reviewed. No pertinent past surgical history.  ALLERGIES:  is allergic to flagyl and levaquin.  MEDICATIONS:  Current Facility-Administered Medications  Medication Dose Route Frequency Provider Last Rate Last Dose  . acetaminophen (TYLENOL) tablet 650 mg  650 mg Oral Q6H PRN Katha Hamming, MD       Or  . acetaminophen (TYLENOL) suppository 650 mg  650 mg Rectal Q6H PRN Katha Hamming, MD      . cefTAZidime (FORTAZ) 500 mg in dextrose 5 % 50 mL IVPB  500 mg Intravenous Q24H Srikar Sudini, MD      . dextrose 5 % with KCl 20 mEq / L  infusion  20 mEq Intravenous Continuous Srikar Sudini, MD 100 mL/hr at 06/30/15 1510 20 mEq at 06/30/15 1510  . donepezil (ARICEPT) tablet 10 mg  10 mg Oral Daily Katha Hamming, MD   10 mg at 06/28/15 1025  . FLUoxetine (PROZAC) capsule 10 mg  10 mg Oral BID Katha Hamming, MD   10 mg at 06/27/15 2205  . ondansetron (ZOFRAN) injection 4 mg  4 mg  Intravenous Q6H PRN Katha Hamming, MD      . sodium hypochlorite (DAKIN'S 1/4 STRENGTH) topical solution   Irrigation BID Srikar Sudini, MD      . vancomycin (VANCOCIN) IVPB 750 mg/150 ml premix  750 mg Intravenous Q48H Crystal G Scarpena, RPH        Vital Signs: BP 90/45 mmHg  Pulse 99  Temp(Src) 99 F (37.2 C) (Oral)  Resp 17  Ht  (1.6 m)  Wt 46.494 kg (102 lb 8 oz)  BMI 18.16 kg/m2  SpO2 100% Filed Weights   06/27/15 1450 06/27/15 2013  Weight: 52.164 kg (115 lb) 46.494 kg (102 lb 8 oz)    Estimated body mass index is 18.16 kg/(m^2) as calculated from the following:   Height as of this encounter:  (1.6 m).   Weight as of this encounter: 46.494 kg (102 lb 8 oz).  PERFORMANCE STATUS (ECOG) : 4 - Bedbound  PHYSICAL EXAM: Obtunded Temporal wasting noted Neck w/o JVD or TM Hrt rrr no mgr Lungs with decreased BS bases Abd soft and nt Ext no mottling or cyanosis but I noted multiple dressings and heel boots in place to address pts multiple decubiti.    LABS: CBC:    Component Value Date/Time   WBC 32.0* 06/30/2015 0528   HGB 8.4* 06/30/2015 0528   HCT 26.7* 06/30/2015 0528   PLT 460* 06/30/2015 0528   MCV 82.9 06/30/2015 0528   NEUTROABS 28.2* 06/30/2015 0528   LYMPHSABS 2.3 06/30/2015 0528   MONOABS 0.9 06/30/2015 0528   EOSABS 0.7 06/30/2015 0528   BASOSABS 0.0 06/30/2015 0528   Comprehensive Metabolic Panel:    Component Value Date/Time   NA 142 06/30/2015 0528   K 3.6 06/30/2015 0528   CL 111 06/30/2015 0528   CO2 23 06/30/2015 0528   BUN 54* 06/30/2015 0528   CREATININE 1.78* 06/30/2015 0528   GLUCOSE 131* 06/30/2015 0528   CALCIUM 8.8* 06/30/2015 0528   AST 32 06/28/2015 0524   ALT 33 06/28/2015 0524   ALKPHOS 92 06/28/2015 0524   BILITOT 0.3 06/28/2015 0524   PROT 7.1 06/28/2015 0524   ALBUMIN 2.3* 06/28/2015 0524     More than 50% of the visit was spent in counseling/coordination of care: Yes  Time Spent: 50 minutes

## 2015-06-30 NOTE — Evaluation (Signed)
Clinical/Bedside Swallow Evaluation Patient Details  Name: Cynthia Robles MRN: [id number] Date of Birth: December 04, 1930  Today's Date: 06/30/2015 Time: SLP Start Time (ACUTE ONLY): 1420 SLP Stop Time (ACUTE ONLY): 1520 SLP Time Calculation (min) (ACUTE ONLY): 60 min  Past Medical History:  Past Medical History  Diagnosis Date  . Anxiety   . Alzheimer disease   . Dementia   . High cholesterol   . Alzheimer disease   . Osteoporosis    Past Surgical History: History reviewed. No pertinent past surgical history. HPI:  Cynthia Robles is a 81 y.o. female with a known history of Alzheimer's dementia, and deficiency anemia, recurrent UTIs brought in by family secondary to poor by mouth intake for the past 3 days. Patient has 24-hour caregivers at home, they noticed that for the past 3 days she is going downhill with the decreased by mouth intake, worsening dementia, difficulty swallowing. No episodes of nausea, vomiting, diarrhea, fever. According to the family also noticed bedsores in the sacral area. Patient's sodium found to be 161 today. An white count up to 39.    Assessment / Plan / Recommendation Clinical Impression  Pt presents with oral phase dysphagia c/b poor labial seal, poor awareness of bolus, lack of manipulation of bolus, delayed swallow and overall responses that are more reflexive than purposeful. Pt to remain NPO at this time.  ST unable to assess pharyngeal phase of swallow on trials given at this time. Daughter present during ST visit and agrees with plan of care. NSG updated. Pt has known history of dementia (per chart) and was not able to attend to tasks during this visit in order to more fully assess swallow function.  In order to provide oral stim and pleasure, ice chips at lips for stim is recommended at this time. This is to be done with strict aspiration precautions (daughter agreed and received ST education).    Aspiration Risk  Moderate (based on severe cognitive  deficits at baseline)    Diet Recommendation NPO   Medication Administration: Via alternative means    Other  Recommendations Oral Care Recommendations: Oral care BID;Staff/trained caregiver to provide oral care   Follow Up Recommendations       Frequency and Duration min 2x/week  1 week   Pertinent Vitals/Pain None reported.    SLP Swallow Goals   See plan of care.   Swallow Study Prior Functional Status    Pt at home with 24/7 NSG care prior to admission. Dementia per chart.    General Date of Onset: 06/27/15 Other Pertinent Information: Cynthia Robles is a 81 y.o. female with a known history of Alzheimer's dementia, and deficiency anemia, recurrent UTIs brought in by family secondary to poor by mouth intake for the past 3 days. Patient has 24-hour caregivers at home, they noticed that for the past 3 days she is going downhill with the decreased by mouth intake, worsening dementia, difficulty swallowing. No episodes of nausea, vomiting, diarrhea, fever. According to the family also noticed bedsores in the sacral area. Patient's sodium found to be 161 today. An white count up to 39.  Type of Study: Bedside swallow evaluation Temperature Spikes Noted: No History of Recent Intubation: No Behavior/Cognition: Lethargic/Drowsy;Requires cueing;Confused Oral Cavity - Dentition: Adequate natural dentition/normal for age Self-Feeding Abilities: Total assist Patient Positioning: Upright in bed Baseline Vocal Quality: Not observed Volitional Cough: Cognitively unable to elicit Volitional Swallow: Unable to elicit    Oral/Motor/Sensory Function Overall Oral Motor/Sensory Function: Impaired at baseline (unable to  assess) Labial ROM:  (unable to assess) Labial Symmetry:  (unable to assess) Labial Strength:  (unable to assess) Labial Sensation:  (unable to assess) Lingual ROM:  (unable to assess) Lingual Symmetry:  (unable to assess) Lingual Strength:  (unable to assess) Lingual  Sensation:  (unable to assess) Facial ROM:  (unable to assess) Facial Symmetry:  (unable to assess) Facial Strength:  (unable to assess) Facial Sensation:  (unable to assess) Velum:  (unable to assess) Mandible:  (unable to assess)   Ice Chips Ice chips: Impaired Presentation:  (ice chip presented to lips, then placed in cheek) Oral Phase Impairments: Reduced labial seal;Reduced lingual movement/coordination;Impaired anterior to posterior transit;Impaired mastication;Poor awareness of bolus Oral Phase Functional Implications: Right anterior spillage;Oral holding;Prolonged oral transit Other Comments: ST unable to assess fully, pt. was stimulated with ice chip around lips, then placed in cheek.  At points, ice chip would fall out and on other trials it would melt in her cheek.  Suspect reactions more reflexive than purposeful.   Thin Liquid Thin Liquid: Not tested    Nectar Thick Nectar Thick Liquid: Not tested   Honey Thick Honey Thick Liquid: Not tested   Puree Puree: Not tested   Solid   GO    Solid: Not tested       Meghan Esper 06/30/2015,3:20 PM

## 2015-06-30 NOTE — Progress Notes (Signed)
Initial Nutrition Assessment    INTERVENTION:   Coordination of Care: noted family does not want PEG tube and is appropriate for comfort measures per MD notes; remains NPO. If able to advance diet, will cater to preferences  NUTRITION DIAGNOSIS:   Inadequate oral intake related to acute illness, lethargy/confusion, chronic illness as evidenced by NPO status.  GOAL:   Patient will meet greater than or equal to 90% of their needs  MONITOR:    (Energy Intake, Anthropometrics, Digestive System, Electrolyte/Renal Profile)  REASON FOR ASSESSMENT:    (pressure ulcers)    ASSESSMENT:    Pt admitted with ARF with dehydration, hypernatremia, poor oral intake; UTI with sepsis, AMS with dementia at baseline; pt is lethargic, non-verbal  Past Medical History  Diagnosis Date  . Anxiety   . Alzheimer disease   . Dementia   . High cholesterol   . Alzheimer disease   . Osteoporosis     Diet Order:  Diet NPO time specified  Skin:   pt with multiple pressure ulcers, stage III, IV, unstageable  Last BM:  9/27  Electrolyte and Renal Profile:  Recent Labs Lab 06/29/15 0630 06/29/15 1724 06/30/15 0528  BUN 82* 68* 54*  CREATININE 2.24* 2.01* 1.78*  NA 150* 146* 142  K 2.8* 3.8 3.6   Glucose Profile: No results for input(s): GLUCAP in the last 72 hours. Nutritional Anemia Profile:  CBC Latest Ref Rng 06/30/2015 06/29/2015 06/27/2015  WBC 3.6 - 11.0 K/uL 32.0(H) 31.9(H) 39.1(H)  Hemoglobin 12.0 - 16.0 g/dL 4.6(N) 7.8(L) 9.9(L)  Hematocrit 35.0 - 47.0 % 26.7(L) 24.8(L) 32.3(L)  Platelets 150 - 440 K/uL 460(H) 478(H) 680(H)    Meds: D5 with KCl at 100 ml/hr (408 kcals) Height:   Ht Readings from Last 1 Encounters:  06/27/15  (1.6 m)    Weight:   Wt Readings from Last 1 Encounters:  06/27/15 102 lb 8 oz (46.494 kg)    BMI:  Body mass index is 18.16 kg/(m^2).  LOW Care Level due to current poc, family does not want PEG tube  Romelle Starcher MS, RD, LDN [phone] Pager'

## 2015-06-30 NOTE — Progress Notes (Signed)
[facility] [place] at [facility]   PATIENT NAME: Cynthia Robles    MR#:  [id number]  DATE OF BIRTH:  [birth year]  SUBJECTIVE:  CHIEF COMPLAINT:   Chief Complaint  Patient presents with  . Loss of Consciousness  Still drowzy and non verbal. Afebrile. Caregiver at bedside.  REVIEW OF SYSTEMS:    Review of Systems  Unable to perform ROS: acuity of condition      DRUG ALLERGIES:   Allergies  Allergen Reactions  . Flagyl [Metronidazole] Other (See Comments)    Reaction:  Unknown   . Levaquin [Levofloxacin In D5w] Hives    VITALS:  Blood pressure 90/45, pulse 99, temperature 99 F (37.2 C), temperature source Oral, resp. rate 17, height  (1.6 m), weight 46.494 kg (102 lb 8 oz), SpO2 100 %.  PHYSICAL EXAMINATION:   Physical Exam  GENERAL:  81 y.o.-year-old patient lying in the bed with no acute distress. Looks pale and critically ill EYES: Pupils equal, round, reactive to light and accommodation. No scleral icterus. Extraocular muscles intact.  HEENT: Head atraumatic, normocephalic. Oropharynx and nasopharynx clear.  NECK:  Supple, no jugular venous distention. No thyroid enlargement, no tenderness.  LUNGS: Normal breath sounds bilaterally, no wheezing, rales, rhonchi. No use of accessory muscles of respiration.  CARDIOVASCULAR: S1, S2 normal. No murmurs, rubs, or gallops.  ABDOMEN: Soft, nontender, nondistended. Bowel sounds present. No organomegaly or mass.  EXTREMITIES: No cyanosis, clubbing or edema b/l.    NEUROLOGIC: Cranial nerves II through XII are intact. No focal Motor or sensory deficits b/l.   PSYCHIATRIC: The patient is nonverbal and drowzy. SKIN: No obvious rash, lesion, or ulcer.    LABORATORY PANEL:   CBC  Recent Labs Lab 06/30/15 0528  WBC 32.0*  HGB 8.4*  HCT 26.7*  PLT 460*   ------------------------------------------------------------------------------------------------------------------  Chemistries    Recent Labs Lab 06/28/15 0524  06/30/15 0528  NA 162*  < > 142  K 3.1*  < > 3.6  CL 126*  < > 111  CO2 28  < > 23  GLUCOSE 122*  < > 131*  BUN 113*  < > 54*  CREATININE 2.87*  < > 1.78*  CALCIUM 10.9*  < > 8.8*  AST 32  --   --   ALT 33  --   --   ALKPHOS 92  --   --   BILITOT 0.3  --   --   < > = values in this interval not displayed. ------------------------------------------------------------------------------------------------------------------  Cardiac Enzymes  Recent Labs Lab 06/27/15 1459  TROPONINI 0.07*   ------------------------------------------------------------------------------------------------------------------  RADIOLOGY:  No results found.   ASSESSMENT AND PLAN:   [age]-year-old female patient with severe Alzheimer's dementia comes in with poor by mouth intake for 3 days found to have acute renal failure, hypernatremia, severe leukocytosis.  * Acute renal failure secondary to severe dehydration - improving On IVF. Decrease fluids. Monitor I/O and Bun/cr. . * Severe hypernatremia with dehydration - improving On D5w IVF  * Pseudomonas UTI with sepsis Antibiotics changed from ceftriaxone to ceftazidime.  * Gram-positive cocci bacteremia One out of 2 bottles. Could be contaminant. We will wait for final culture results. Continue IV vancomycin.  * Dementia with poor appetite - worsening No PEG per HCPOA at bedside.  * Acute encephalopathy present on admission is due to severe hypernatremia and UTI.  * Left kidney cyst OP workup  Patient seems appropriate for comfortable care. Discussed with Dr. Orvan Falconer of palliative  care.   All the records are reviewed and case discussed with Care Management/Social Workerr. Management plans discussed with the patient, family and they are in agreement.  CODE STATUS: DNR  DVT Prophylaxis: SCDs  TOTAL TIME TAKING CARE OF THIS PATIENT: 35 minutes.   POSSIBLE D/C IN 2-3 DAYS, DEPENDING ON CLINICAL  CONDITION.   Milagros Loll R M.D on 06/30/2015 at 12:33 PM  Between 7am to 6pm - Pager - [phone]  After 6pm go to [email or web address] - password [email or web address]  [facility]  Office  [phone]  CC: Primary care physician; Alan Mulder, MD    Note: This dictation was prepared with Dragon dictation along with smaller phrase technology. Any transcriptional errors that result from this process are unintentional.

## 2015-06-30 NOTE — Progress Notes (Signed)
Palliative Medicine Inpatient Consult Follow Up Note   Name: Cynthia Robles Date: 06/30/2015 MRN: [id number]  DOB: 11/14/1930  Referring Physician: Milagros Loll, MD  Palliative Care consult requested for this 81 y.o. female for goals of medical therapy in patient with sepsis, bacteremia w/ GPC, multiple decubiti, dehydration, and dementia.  I have finally been able to talk with her daughter by phone this evening.    IMPRESSION: 1. Sepsis --GPC growing ---probably from decubiti source  --on Vanco 2.  Recent UTI 3.  Dementia --advanced 4.  Multiple decubiti with very large sacral ulcer with foul smelling slough ( see wound nurse note from 06/29/15) 5.   Left kidney cyst (I would not work this up) 6.  Acute Renal Failure due to ATN from dehydration --with unclear baseline renal function --somewhat better 7.  Dehydration due to poor oral intake due to dementia and sepsis 8.  Hypernatremia due to poor oral intake 9.  Advanced Alzheimer's Dementia --she has had 24hr care by paid aids in pts own home where she lives alone  --bedbound 10. Severe Malnutrition due to advanced dementia  11. Severe leukocytosis 12.  Weakness and debility 13.  Metabolic encephalopathy --unresponsive 14. Osteoporosis 15. H/ O dyslipidemia 16. H/O anxiety disorder   TODAY'S DISCUSSIONS, DECISIONS, AND PLANS:  She is DNR status and it has been stated that daughter would not want pt to have a feeding tube.  The feeding tube issue is not as settled as stated, however, per my discussion with daughter. This is up in the air right now until I can meet with daughter TOMORROW around 10 am when she arrives .  We will look at her wounds together.  Pts sister will be here also.   I spent a long time on the phone discussing patient's condition and various care options going forward (including Hospice Home, Hospice in the Home ( with downside being that pts private pay aids cannot give prn meds and it would be better to  have family present whenever possible), SNF, Long Term Care bed in a facility with Hospice care etc).  Patient's decubiti make going home a less desirable option. Daughter says she didn't know they were 'that bad'.  Will have to let some decisions be made over time as further discussions take place.     REVIEW OF SYSTEMS:  Patient is not able to provide ROS due to being obtunded  CODE STATUS: DNR   PAST MEDICAL HISTORY: Past Medical History  Diagnosis Date  . Anxiety   . Alzheimer disease   . Dementia   . High cholesterol   . Alzheimer disease   . Osteoporosis     PAST SURGICAL HISTORY: History reviewed. No pertinent past surgical history.  Vital Signs: BP 90/45 mmHg  Pulse 99  Temp(Src) 99 F (37.2 C) (Oral)  Resp 17  Ht  (1.6 m)  Wt 46.494 kg (102 lb 8 oz)  BMI 18.16 kg/m2  SpO2 100% Filed Weights   06/27/15 1450 06/27/15 2013  Weight: 52.164 kg (115 lb) 46.494 kg (102 lb 8 oz)    Estimated body mass index is 18.16 kg/(m^2) as calculated from the following:   Height as of this encounter:  (1.6 m).   Weight as of this encounter: 46.494 kg (102 lb 8 oz).  PHYSICAL EXAM: Lying in bed with head turned to one side --unrsponsive except for a slight grimace to pain Eyes closed Neck w/o JVD or TM Heart rrr no mgr Lungs  cta no rales Abd soft and nontender Ext muscle atrophy noted Skin --she has mutliple decubiti    Sacral decubitus is reportedly very large at 26 cm x 16 cm and 100% foul smelling gray slough with wound bed not seen (getting Dakin's on this for odor).  Other wounds as per wound care nurse --I have seen dressing edges and will attempt to see wounds tomorrow  (see order to change wound dressings after 10 am so we can see wounds and so can family)  LABS: CBC:    Component Value Date/Time   WBC 32.0* 06/30/2015 0528   HGB 8.4* 06/30/2015 0528   HCT 26.7* 06/30/2015 0528   PLT 460* 06/30/2015 0528   MCV 82.9 06/30/2015 0528   NEUTROABS 28.2*  06/30/2015 0528   LYMPHSABS 2.3 06/30/2015 0528   MONOABS 0.9 06/30/2015 0528   EOSABS 0.7 06/30/2015 0528   BASOSABS 0.0 06/30/2015 0528   Comprehensive Metabolic Panel:    Component Value Date/Time   NA 142 06/30/2015 0528   K 3.6 06/30/2015 0528   CL 111 06/30/2015 0528   CO2 23 06/30/2015 0528   BUN 54* 06/30/2015 0528   CREATININE 1.78* 06/30/2015 0528   GLUCOSE 131* 06/30/2015 0528   CALCIUM 8.8* 06/30/2015 0528   AST 32 06/28/2015 0524   ALT 33 06/28/2015 0524   ALKPHOS 92 06/28/2015 0524   BILITOT 0.3 06/28/2015 0524   PROT 7.1 06/28/2015 0524   ALBUMIN 2.3* 06/28/2015 0524     More than 50% of the visit was spent in counseling/coordination of care: YES  Time Spent: 45 min

## 2015-07-01 DIAGNOSIS — IMO0001 Reserved for inherently not codable concepts without codable children: Secondary | ICD-10-CM | POA: Insufficient documentation

## 2015-07-01 LAB — CBC WITH DIFFERENTIAL/PLATELET
BASOS ABS: 0 10*3/uL (ref 0–0.1)
BASOS PCT: 0 %
Eosinophils Absolute: 0.3 10*3/uL (ref 0–0.7)
Eosinophils Relative: 1 %
HEMATOCRIT: 23.3 % — AB (ref 35.0–47.0)
Hemoglobin: 7.4 g/dL — ABNORMAL LOW (ref 12.0–16.0)
Lymphocytes Relative: 5 %
Lymphs Abs: 2.1 10*3/uL (ref 1.0–3.6)
MCH: 25.7 pg — ABNORMAL LOW (ref 26.0–34.0)
MCHC: 31.6 g/dL — ABNORMAL LOW (ref 32.0–36.0)
MCV: 81.2 fL (ref 80.0–100.0)
MONO ABS: 0.7 10*3/uL (ref 0.2–0.9)
Monocytes Relative: 2 %
NEUTROS ABS: 37.8 10*3/uL — AB (ref 1.4–6.5)
NEUTROS PCT: 92 %
Platelets: 426 10*3/uL (ref 150–440)
RBC: 2.87 MIL/uL — AB (ref 3.80–5.20)
RDW: 15.6 % — AB (ref 11.5–14.5)
WBC: 40.9 10*3/uL — AB (ref 3.6–11.0)

## 2015-07-01 LAB — BASIC METABOLIC PANEL
ANION GAP: 8 (ref 5–15)
BUN: 37 mg/dL — ABNORMAL HIGH (ref 6–20)
CALCIUM: 8.1 mg/dL — AB (ref 8.9–10.3)
CO2: 21 mmol/L — AB (ref 22–32)
Chloride: 105 mmol/L (ref 101–111)
Creatinine, Ser: 1.51 mg/dL — ABNORMAL HIGH (ref 0.44–1.00)
GFR, EST AFRICAN AMERICAN: 36 mL/min — AB (ref >60)
GFR, EST NON AFRICAN AMERICAN: 31 mL/min — AB (ref >60)
Glucose, Bld: 129 mg/dL — ABNORMAL HIGH (ref 65–99)
POTASSIUM: 3.7 mmol/L (ref 3.5–5.1)
Sodium: 134 mmol/L — ABNORMAL LOW (ref 135–145)

## 2015-07-01 MED ORDER — BISACODYL 10 MG RE SUPP: 10.0000 mg | Freq: Every day | RECTAL | Status: AC | PRN

## 2015-07-01 MED ORDER — PROCHLORPERAZINE 25 MG RE SUPP: 25.0000 mg | Freq: Two times a day (BID) | RECTAL | Status: AC | PRN

## 2015-07-01 MED ORDER — MORPHINE SULFATE (CONCENTRATE) 10 MG/0.5ML PO SOLN: 2.6000 mg | Freq: Four times a day (QID) | ORAL | Status: AC

## 2015-07-01 MED ORDER — PROCHLORPERAZINE 25 MG RE SUPP
25.0000 mg | Freq: Two times a day (BID) | RECTAL | Status: DC | PRN
  Filled 2015-07-01: qty 1

## 2015-07-01 MED ORDER — GLYCOPYRROLATE 1 MG PO TABS: 1.0000 mg | ORAL_TABLET | Freq: Three times a day (TID) | ORAL | Status: AC | PRN

## 2015-07-01 MED ORDER — LORAZEPAM 2 MG/ML IJ SOLN: 0.5000 mg | INTRAMUSCULAR | Status: DC | PRN

## 2015-07-01 MED ORDER — MORPHINE SULFATE (CONCENTRATE) 10 MG/0.5ML PO SOLN: 5.0000 mg | ORAL | Status: DC | PRN

## 2015-07-01 MED ORDER — MORPHINE SULFATE (CONCENTRATE) 10 MG/0.5ML PO SOLN: 5.0000 mg | ORAL | Status: AC | PRN

## 2015-07-01 MED ORDER — POTASSIUM CHLORIDE IN NACL 20-0.9 MEQ/L-% IV SOLN
INTRAVENOUS | Status: DC
  Administered 2015-07-01: 08:00:00 via INTRAVENOUS
  Filled 2015-07-01 (×2): qty 1000

## 2015-07-01 MED ORDER — GLYCOPYRROLATE 1 MG PO TABS: 1.0000 mg | ORAL_TABLET | Freq: Three times a day (TID) | ORAL | Status: DC | PRN

## 2015-07-01 MED ORDER — LORAZEPAM 0.5 MG PO TABS: 0.5000 mg | ORAL_TABLET | Freq: Four times a day (QID) | ORAL | Status: AC | PRN

## 2015-07-01 MED ORDER — PROCHLORPERAZINE 25 MG RE SUPP: 25.0000 mg | Freq: Two times a day (BID) | RECTAL | Status: DC | PRN

## 2015-07-01 MED ORDER — MORPHINE SULFATE (CONCENTRATE) 10 MG/0.5ML PO SOLN: 2.6000 mg | Freq: Four times a day (QID) | ORAL | Status: DC

## 2015-07-01 MED ORDER — DAKINS (1/4 STRENGTH) 0.125 % EX SOLN: Freq: Two times a day (BID) | CUTANEOUS | Status: AC

## 2015-07-01 NOTE — Care Management Important Message (Signed)
Important Message  Patient Details  Name: Cynthia Robles MRN: [id number] Date of Birth: 04-25-1931   Medicare Important Message Given:  Yes-third notification given    Verita Schneiders Allmond 07/01/2015, 10:21 AM

## 2015-07-01 NOTE — Clinical Social Work Note (Signed)
CSW informed by [facility] that patient's family has chosen for patient to go to hospice home today. Patient to transport via EMS. York Spaniel MSW,LCSW [phone]

## 2015-07-01 NOTE — Progress Notes (Signed)
Speech Language Pathology Treatment:    Patient Details Name: Cynthia Robles MRN: [id number] DOB: [birth year] Today's Date: 07/01/2015 Time: 0930-1030 SLP Time Calculation (min) (ACUTE ONLY): 60 min  Assessment / Plan / Recommendation Clinical Impression  Pt presents with continued oral phase dysphagia c/b poor labial seal, poor awareness of bolus, lack of manipulation of bolus, delayed swallow and overall responses that are more reflexive than purposeful. Pt to remain NPO at this time. ST continues to be unable to assess pharyngeal phase of swallow on trials given. Daughter and Pt's sister present during ST visit and agrees with plan of care. NSG updated. Pt has known history of dementia (per chart) and was not able to attend to tasks during this visit in order to more fully assess swallow function.   In order to provide oral stim and pleasure, ice chips at lips for stim is recommended at this time. This is to be done with strict aspiration precautions (daughter agreed and received ST education). Pt does not attend to task and continues to show behavior that is reflexive rather than purposeful.   HPI Other Pertinent Information: Cynthia Robles is a 81 y.o. female with a known history of Alzheimer's dementia, and deficiency anemia, recurrent UTIs brought in by family secondary to poor by mouth intake for the past 3 days. Patient has 24-hour caregivers at home, they noticed that for the past 3 days she is going downhill with the decreased by mouth intake, worsening dementia, difficulty swallowing. No episodes of nausea, vomiting, diarrhea, fever. According to the family also noticed bedsores in the sacral area. Patient's sodium found to be 161 today. An white count up to 39.    Pertinent Vitals    SLP Plan  Continue with current plan of care    Recommendations Medication Administration: Via alternative means              Oral Care Recommendations: Oral care BID;Staff/trained caregiver to  provide oral care Plan: Continue with current plan of care    GO     Meghan Esper 07/01/2015, 10:42 AM

## 2015-07-01 NOTE — Progress Notes (Signed)
[facility] - [place] at [facility]   PATIENT NAME: Cynthia Robles    MR#:  [id number]  DATE OF BIRTH:  1931/01/05  SUBJECTIVE:  CHIEF COMPLAINT:   Chief Complaint  Patient presents with  . Loss of Consciousness  Seen earlier today. Still drowzy and non verbal. Afebrile.  REVIEW OF SYSTEMS:    Review of Systems  Unable to perform ROS: acuity of condition      DRUG ALLERGIES:   Allergies  Allergen Reactions  . Flagyl [Metronidazole] Other (See Comments)    Reaction:  Unknown   . Levaquin [Levofloxacin In D5w] Hives    VITALS:  Blood pressure 101/56, pulse 80, temperature 98.3 F (36.8 C), temperature source Oral, resp. rate 20, height  (1.6 m), weight 46.494 kg (102 lb 8 oz), SpO2 97 %.  PHYSICAL EXAMINATION:   Physical Exam  GENERAL:  81 y.o.-year-old patient lying in the bed with no acute distress. Looks pale and critically ill EYES: Pupils equal, round, reactive to light and accommodation. No scleral icterus. Extraocular muscles intact.  HEENT: Head atraumatic, normocephalic. Oropharynx and nasopharynx clear.  NECK:  Supple, no jugular venous distention. No thyroid enlargement, no tenderness.  LUNGS: Normal breath sounds bilaterally, no wheezing, rales, rhonchi. No use of accessory muscles of respiration.  CARDIOVASCULAR: S1, S2 normal. No murmurs, rubs, or gallops.  ABDOMEN: Soft, nontender, nondistended. Bowel sounds present. No organomegaly or mass.  EXTREMITIES: No cyanosis, clubbing or edema b/l.    NEUROLOGIC: Cranial nerves II through XII are intact. No focal Motor or sensory deficits b/l.   PSYCHIATRIC: The patient is nonverbal and drowzy. SKIN: No obvious rash., Large sacral decubitus ulcer, unstageable. Purulent discharge   LABORATORY PANEL:   CBC  Recent Labs Lab 07/01/15 0522  WBC 40.9*  HGB 7.4*  HCT 23.3*  PLT 426    ------------------------------------------------------------------------------------------------------------------  Chemistries   Recent Labs Lab 06/28/15 0524  07/01/15 0522  NA 162*  < > 134*  K 3.1*  < > 3.7  CL 126*  < > 105  CO2 28  < > 21*  GLUCOSE 122*  < > 129*  BUN 113*  < > 37*  CREATININE 2.87*  < > 1.51*  CALCIUM 10.9*  < > 8.1*  AST 32  --   --   ALT 33  --   --   ALKPHOS 92  --   --   BILITOT 0.3  --   --   < > = values in this interval not displayed. ------------------------------------------------------------------------------------------------------------------  Cardiac Enzymes  Recent Labs Lab 06/27/15 1459  TROPONINI 0.07*   ------------------------------------------------------------------------------------------------------------------  RADIOLOGY:  No results found.   ASSESSMENT AND PLAN:   [age]-year-old female patient with severe Alzheimer's dementia comes in with poor by mouth intake for 3 days found to have acute renal failure, hypernatremia, severe leukocytosis.  * Acute renal failure secondary to severe dehydration - improving On IVF. Decrease fluids. Monitor I/O and Bun/cr. . * Severe hypernatremia with dehydration - improving On D5w IVF  * Pseudomonas UTI with sepsis Antibiotics changed from ceftriaxone to ceftazidime.  * Gram-positive cocci bacteremia likely from infected sacral decubitus ulcer. One out of 2 bottles. Could be contaminant. We will wait for final culture results. Continue IV vancomycin.  * Dementia with poor appetite - worsening No PEG per HCPOA at bedside.  * Acute encephalopathy present on admission is due to severe hypernatremia and UTI.  * Left kidney cyst OP workup   All the  records are reviewed and case discussed with Care Management/Social Workerr. Management plans discussed with the patient, family and they are in agreement.  CODE STATUS: DNR  DVT Prophylaxis: SCDs  TOTAL TIME TAKING CARE OF  THIS PATIENT: 35 minutes.    Discussed with Dr. Orvan Falconer of palliative care who had lengthy discussion with family and now plan is for hospice home vs home with hospice with comfort care.   Milagros Loll R M.D on 07/01/2015 at 1:29 PM  Between 7am to 6pm - Pager - [phone]  After 6pm go to [email or web address] - password [email or web address]  [facility]  Office  [phone]  CC: Primary care physician; Alan Mulder, MD    Note: This dictation was prepared with Dragon dictation along with smaller phrase technology. Any transcriptional errors that result from this process are unintentional.

## 2015-07-01 NOTE — Progress Notes (Signed)
New hospice home referral received from [place]. Cynthia Robles is an 81 year old woman brought to the [facility] ER for evaluation of poor po intake and difficulty swallowing. She was found to have a sodium of 161, and leukocytosis assumed from a UTI. She has significant pressure ulcers/wounds, albumin 2.3, has been treated with IV antibiotics with no improvement in po intake, WBC today 40.9. Patient's daughter Cynthia Robles and sister Cynthia Robles met with palliative Medicine physician Dr. Megan Salon and have chosen to focus on comfort and end of life care at the hospice home. Writer met with Cynthia Robles and Cynthia Robles in the room. Patient did not awaken or participate in the conversation. Education initiated related to hospice services, team approach and philosophy of care with good understanding voiced. Consents signed and faxed along with patient information to referral intake. Staff RN Seleta Rhymes, CSW Brayton Layman, attending physician Dr. Darvin Neighbours and Dr. Megan Salon aware and in agreement with discharge to the hospice home today via EMS with portable DRN in place. Report called to hospice home. EMS notified for pick up. Thank you for the opportunity to participate in the care of this patient.  Flo Shanks RN, BSN, [place] [facility] [place], [facility] [phone] c

## 2015-07-01 NOTE — Progress Notes (Signed)
Palliative Medicine Inpatient Consult Follow Up Note   Name: Cynthia Robles Date: 07/01/2015 MRN: [id number]  DOB: 11-12-1930  Referring Physician: Milagros Loll, MD  Palliative Care consult requested for this 81 y.o. female for goals of medical therapy in patient with sepsis due to sacral decubitus (most likely and in my personal opinion).    IMPRESSION: 1.  Sepsis due to sacral decubitus (most likely)  ----present on admission ----not due to UTI as had been thought ----with gram positive cocci growing in blood cxs X2 but ID pending 2.  Severe decubiti with worst of all on sacrum ----all present at admission ----treated at pts home by aids 'doing the best they knew' per family -- since they say no instructions were given re: wound care when this first developed and was seen by health care providers recently Stage 3 left trochanter 3 cm x 3.2 cm x 0.2 cm unstagable Left heel is 2 cm x 2 cx by 0.2 cm but necrotic tissue unstagable Right heel is 3 cm x 3 cm.2 cm by 0.2 cm unstageable Thoracic spine is 4 cm by 3.1 cm c -2. Cm pink and moist stage 1 Coccyx and sacrum 26 cm x 16 xm xfoul smelling gray slough and unstagable (with deep tissue injury probable and extensive) 2. Hypernatremia due to dehydration due to decreased oral intake  3. Severe leukocytosis --now worsening due to lack of response to ABX for sepsis 4.  Alzheimer's Dementia 5. Acute Renal Flre due to ATN due to poor intake due to effects of UTI 6.. Left kidney cyst seen on renal US (OP work up IF desired)   DISCUSSIONS AND PLAN: 1.  I spent nearly 2 hours counselling family about options and they are now aware of the liklihood that pt will not survive this illness and if she did, she would still have a severe decubitis that would cause sepsis again and again AND she wouldn't be eating well, etc.  They ultimately, after my discussion and after that of the Hospice Nurse Liaison, have opted for Hospice Home today. 2.  I  will do DC orders 3.  DNR continues and there is a Form in the paper record.  REVIEW OF SYSTEMS:  Patient is not able to provide ROS due to dementia and being unresponsive  CODE STATUS: DNR   PAST MEDICAL HISTORY: Past Medical History  Diagnosis Date  . Anxiety   . Alzheimer disease   . Dementia   . High cholesterol   . Alzheimer disease   . Osteoporosis     PAST SURGICAL HISTORY: History reviewed. No pertinent past surgical history.  Vital Signs: BP 101/56 mmHg  Pulse 80  Temp(Src) 98.3 F (36.8 C) (Oral)  Resp 20  Ht  (1.6 m)  Wt 46.494 kg (102 lb 8 oz)  BMI 18.16 kg/m2  SpO2 97% Filed Weights   06/27/15 1450 06/27/15 2013  Weight: 52.164 kg (115 lb) 46.494 kg (102 lb 8 oz)    Estimated body mass index is 18.16 kg/(m^2) as calculated from the following:   Height as of this encounter:  (1.6 m).   Weight as of this encounter: 46.494 kg (102 lb 8 oz).  PHYSICAL EXAM: Lying on side with glasses on her eyes --but she does not open her eyes (pt reportedly opened eyes and spoke a few words to daughters earlier today) No JVD or TM Hrt rrr no mgr Lungs with decreased BS bases Abd soft and Nontender  Sacrum  --I  viewed pts sacral ulcer and it is indeed a very extensive and severe decubitus --measurements are as described by wound care nurse but my description is as follows:  There is an area of about 5 to 10 cm of scalded skin periwound. This appears to be very superficial and probably improving. There is an irregular shaped but very large eschar over sacrum extending over rectum and onto both buttocks. It is shiny brown -black and fluctuant --indicating deep tissue injury.  I described the ulcer to the daughters and also have reported this to her other physicians and care team.    Heels are also involved with black eschar lesions that are fluctuant.  She has multiple decubiti. See wound care report/ note and dressing orders for details.    LABS: CBC:     Component Value Date/Time   WBC 40.9* 07/01/2015 0522   HGB 7.4* 07/01/2015 0522   HCT 23.3* 07/01/2015 0522   PLT 426 07/01/2015 0522   MCV 81.2 07/01/2015 0522   NEUTROABS 37.8* 07/01/2015 0522   LYMPHSABS 2.1 07/01/2015 0522   MONOABS 0.7 07/01/2015 0522   EOSABS 0.3 07/01/2015 0522   BASOSABS 0.0 07/01/2015 0522   Comprehensive Metabolic Panel:    Component Value Date/Time   NA 134* 07/01/2015 0522   K 3.7 07/01/2015 0522   CL 105 07/01/2015 0522   CO2 21* 07/01/2015 0522   BUN 37* 07/01/2015 0522   CREATININE 1.51* 07/01/2015 0522   GLUCOSE 129* 07/01/2015 0522   CALCIUM 8.1* 07/01/2015 0522   AST 32 06/28/2015 0524   ALT 33 06/28/2015 0524   ALKPHOS 92 06/28/2015 0524   BILITOT 0.3 06/28/2015 0524   PROT 7.1 06/28/2015 0524   ALBUMIN 2.3* 06/28/2015 0524      REFERRALS TO BE ORDERED:  [facility] [place] re Hospice in Home vs Hospice home   More than 50% of the visit was spent in counseling/coordination of care: YES  Time Spent: 120 minutes

## 2015-07-01 NOTE — Care Management (Signed)
CM following case . Palliative to meet with family today, Case discussed with CSW.   Anticipate that patient will be appropriate for Hospice. Continue to follow.

## 2015-07-01 NOTE — Progress Notes (Signed)
Pt was transported to Hospice via EMS

## 2015-07-01 NOTE — Progress Notes (Signed)
Pt's dressing changes have been done per order. IV's have been removed. Foley catheter was inserted with Magnus Ivan assisting. Awaiting transport via EMS.

## 2015-07-01 NOTE — Discharge Summary (Signed)
[facility] - [place] at [facility]   PATIENT NAME: Cynthia Robles    MR#:  [id number]  DATE OF BIRTH:  1930-10-28  DATE OF ADMISSION:  06/27/2015 ADMITTING PHYSICIAN: Katha Hamming, MD  DATE OF DISCHARGE: No discharge date for patient encounter.  PRIMARY CARE PHYSICIAN: Alan Mulder, MD    ADMISSION DIAGNOSIS:  Dehydration [E86.0] Acute hypernatremia [E87.0] Renal failure [N19] Failure to thrive in adult [R62.7] Acute renal failure, unspecified acute renal failure type [N17.9]  DISCHARGE DIAGNOSIS:  Active Problems:   Pressure ulcer   Hypernatremia   Acute hypernatremia   Blood poisoning   SECONDARY DIAGNOSIS:   Past Medical History  Diagnosis Date  . Anxiety   . Alzheimer disease   . Dementia   . High cholesterol   . Alzheimer disease   . Osteoporosis      ADMITTING HISTORY  Cynthia Robles is a 81 y.o. female with a known history of Alzheimer's dementia, and deficiency anemia, recurrent UTIs brought in by family secondary to poor by mouth intake for the past 3 days. Patient has 24-hour caregivers at home, they noticed that for the past 3 days she is going downhill with the decreased by mouth intake, worsening dementia, difficulty swallowing. No episodes of nausea, vomiting, diarrhea, fever. According to the family also noticed bedsores in the sacral area. Patient's sodium found to be 161 today. An white count up to 39.    HOSPITAL COURSE:   1. Severe sepsis - POA 2. Pseudomaonas UTI 3. GPC clusters bacteremia 4. Sacral decubitus ulcer and other decubiti 5. Hypernatremia 6. Severe dehydration 7. Acute encephalopathy 8. Dementia 9. Hypokalemia 10. ARF  ptient treated aggressively with D5, KCL, IV abx and inpatient care. No improvement inspite of therapy. On further discussing with family by Dr. Orvan Falconer transfer to hospice home for end of life care was thought to be appropriate by family. This has ben arranged and  patient will be transferred.   CONSULTS OBTAINED:     DRUG ALLERGIES:   Allergies  Allergen Reactions  . Flagyl [Metronidazole] Other (See Comments)    Reaction:  Unknown   . Levaquin [Levofloxacin In D5w] Hives    DISCHARGE MEDICATIONS:   Current Discharge Medication List    START taking these medications   Details  bisacodyl (DULCOLAX) 10 MG suppository Place 1 suppository (10 mg total) rectally daily as needed for moderate constipation. Qty: 6 suppository, Refills: 0    glycopyrrolate (ROBINUL) 1 MG tablet Take 1 tablet (1 mg total) by mouth 3 (three) times daily as needed (secretion management). Qty: 10 tablet, Refills: 0    LORazepam (ATIVAN) 0.5 MG tablet Take 1 tablet (0.5 mg total) by mouth every 6 (six) hours as needed for anxiety. Qty: 15 tablet, Refills: 0    !! Morphine Sulfate (MORPHINE CONCENTRATE) 10 MG/0.5ML SOLN concentrated solution Take 0.13 mLs (2.6 mg total) by mouth every 6 (six) hours. Qty: 30 mL, Refills: 0    !! Morphine Sulfate (MORPHINE CONCENTRATE) 10 MG/0.5ML SOLN concentrated solution Take 0.25 mLs (5 mg total) by mouth every hour as needed for severe pain or shortness of breath. Qty: 30 mL, Refills: 0    prochlorperazine (COMPAZINE) 25 MG suppository Place 1 suppository (25 mg total) rectally every 12 (twelve) hours as needed for nausea or vomiting. Qty: 12 suppository, Refills: 0    sodium hypochlorite (DAKIN'S 1/4 STRENGTH) 0.125 % SOLN Irrigate with as directed 2 (two) times daily. Qty: 473 mL, Refills: 0     !! -  Potential duplicate medications found. Please discuss with provider.    STOP taking these medications     acetaminophen (TYLENOL) 500 MG tablet      ALPRAZolam (XANAX) 0.25 MG tablet      aspirin EC 81 MG tablet      Calcium Carbonate-Vitamin D (CALCIUM-D) 600-400 MG-UNIT TABS      denosumab (PROLIA) 60 MG/ML SOLN injection      diphenhydrAMINE (BENADRYL) 25 MG tablet      donepezil (ARICEPT) 10 MG tablet       Ferrous Gluconate 325 (36 FE) MG TABS      FLUoxetine (PROZAC) 10 MG capsule      furosemide (LASIX) 20 MG tablet      Multiple Vitamins-Minerals (MULTIVITAMIN PO)          Today    VITAL SIGNS:  Blood pressure 101/56, pulse 80, temperature 98.3 F (36.8 C), temperature source Oral, resp. rate 20, height  (1.6 m), weight 46.494 kg (102 lb 8 oz), SpO2 97 %.  I/O:   Intake/Output Summary (Last 24 hours) at 07/01/15 1459 Last data filed at 07/01/15 1316  Gross per 24 hour  Intake 2308.67 ml  Output      0 ml  Net 2308.67 ml    PHYSICAL EXAMINATION:  Physical Exam  GENERAL:  81 y.o.-year-old patient lying in the bed with no acute distress.  LUNGS: Normal breath sounds bilaterally, no wheezing, rales,rhonchi or crepitation. No use of accessory muscles of respiration.  CARDIOVASCULAR: S1, S2 normal. No murmurs, rubs, or gallops.  PSYCHIATRIC: The patient is drowzy SKIN: larege scaral ulcer and heel decubiti  DATA REVIEW:   CBC  Recent Labs Lab 07/01/15 0522  WBC 40.9*  HGB 7.4*  HCT 23.3*  PLT 426    Chemistries   Recent Labs Lab 06/28/15 0524  07/01/15 0522  NA 162*  < > 134*  K 3.1*  < > 3.7  CL 126*  < > 105  CO2 28  < > 21*  GLUCOSE 122*  < > 129*  BUN 113*  < > 37*  CREATININE 2.87*  < > 1.51*  CALCIUM 10.9*  < > 8.1*  AST 32  --   --   ALT 33  --   --   ALKPHOS 92  --   --   BILITOT 0.3  --   --   < > = values in this interval not displayed.  Cardiac Enzymes  Recent Labs Lab 06/27/15 1459  TROPONINI 0.07*    Microbiology Results  Results for orders placed or performed during the hospital encounter of 06/27/15  Urine culture     Status: None   Collection Time: 06/27/15  3:34 PM  Result Value Ref Range Status   Specimen Description URINE, RANDOM  Final   Special Requests NONE  Final   Culture 50,000 COLONIES/mL PSEUDOMONAS AERUGINOSA  Final   Report Status 06/29/2015 FINAL  Final   Organism ID, Bacteria PSEUDOMONAS AERUGINOSA   Final      Susceptibility   Pseudomonas aeruginosa - MIC*    CEFTAZIDIME 4 SENSITIVE Sensitive     CIPROFLOXACIN <=0.25 INTERMEDIATE Intermediate     GENTAMICIN 4 SENSITIVE Sensitive     IMIPENEM 2 SENSITIVE Sensitive     PIP/TAZO Value in next row Sensitive      SENSITIVE<=4    * 50,000 COLONIES/mL PSEUDOMONAS AERUGINOSA  Blood culture (routine x 2)     Status: None (Preliminary result)   Collection Time: 06/27/15  5:10 PM  Result Value Ref Range Status   Specimen Description BLOOD LEFT WRIST  Final   Special Requests BLOOD  Final   Culture  Setup Time   Final    GRAM POSITIVE COCCI IN CLUSTERS IN BOTH AEROBIC AND ANAEROBIC BOTTLES CRITICAL RESULT CALLED TO, READ BACK BY AND VERIFIED WITH: TONYA CRANFORD 06/29/15 1505 JGF.    Culture   Final    GRAM POSITIVE COCCI IN BOTH AEROBIC AND ANAEROBIC BOTTLES REPEATING ID AND SUSCEPTIBILITIES    Report Status PENDING  Incomplete  Blood culture (routine x 2)     Status: None (Preliminary result)   Collection Time: 06/27/15  5:10 PM  Result Value Ref Range Status   Specimen Description BLOOD LEFT WRIST  Final   Special Requests BLOOD  Final   Culture NO GROWTH 4 DAYS  Final   Report Status PENDING  Incomplete    RADIOLOGY:  No results found.    Follow up with PCP in 1 week.  Management plans discussed with the patient, family and they are in agreement.  CODE STATUS:     Code Status Orders        Start     Ordered   06/27/15 1706  Do not attempt resuscitation (DNR)   Continuous    Question Answer Comment  In the event of cardiac or respiratory ARREST Do not call a "code blue"   In the event of cardiac or respiratory ARREST Do not perform Intubation, CPR, defibrillation or ACLS   In the event of cardiac or respiratory ARREST Use medication by any route, position, wound care, and other measures to relive pain and suffering. May use oxygen, suction and manual treatment of airway obstruction as needed for comfort.       06/27/15 1709    Advance Directive Documentation        Most Recent Value   Type of Advance Directive  Out of facility DNR (pink MOST or yellow form)   Pre-existing out of facility DNR order (yellow form or pink MOST form)     "MOST" Form in Place?        TOTAL TIME TAKING CARE OF THIS PATIENT ON DAY OF DISCHARGE: more than 30 minutes.    Milagros Loll R M.D on 07/01/2015 at 2:59 PM  Between 7am to 6pm - Pager - [phone]  After 6pm go to [email or web address] - password [email or web address]  [facility]  Office  [phone]  CC: Primary care physician; Alan Mulder, MD     Note: This dictation was prepared with Dragon dictation along with smaller phrase technology. Any transcriptional errors that result from this process are unintentional.

## 2015-07-02 LAB — CULTURE, BLOOD (ROUTINE X 2): Culture: NO GROWTH

## 2015-08-04 DEATH — deceased

## 2015-08-24 ENCOUNTER — Ambulatory Visit: Payer: Medicare PPO

## 2016-11-30 IMAGING — CT CT HEAD W/O CM
2 series · 16 of 30 positions shown, 20 images · non-contrast
Comparison: 11/29/2011 CT

CLINICAL DATA: 83-year-old female with altered mental status and
fever.

EXAM:
CT HEAD WITHOUT CONTRAST
TECHNIQUE: Contiguous axial images were obtained from the base of the skull
through the vertex without intravenous contrast.

[Series 2: head wo · axial · 0.39mm/px · z∈[+506,+649]mm · 13 of 36 slices shown, 17 images]
[im 3/36  brain]
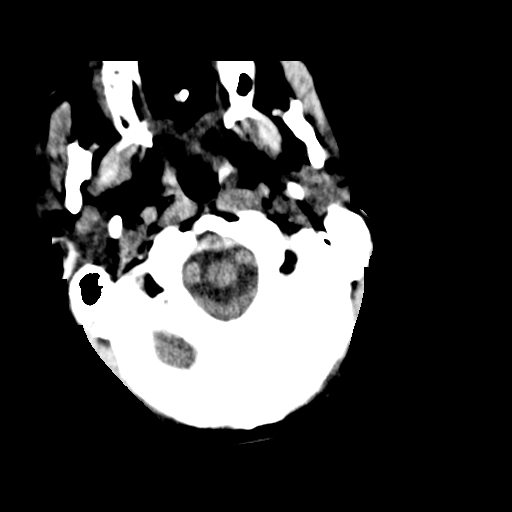
[im 3/36  bone]
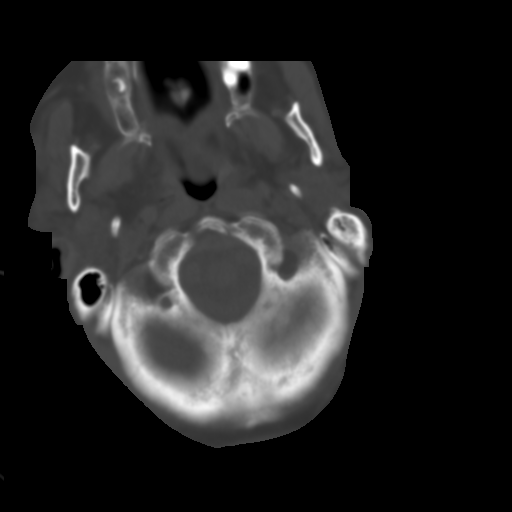
[im 6/36  brain]
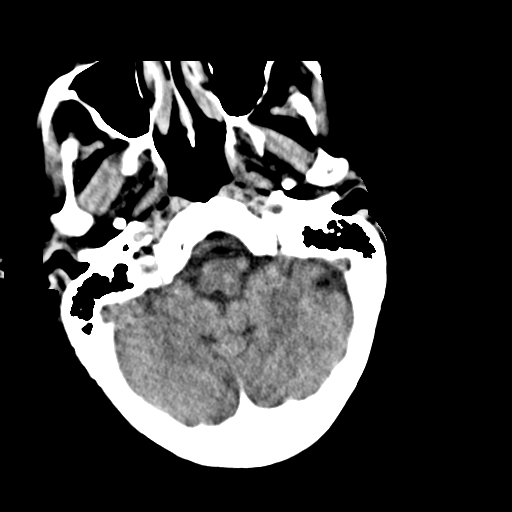
[im 8/36  brain]
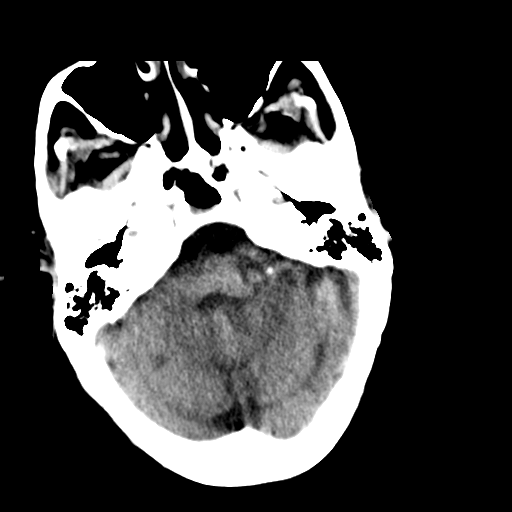
[im 11/36  brain]
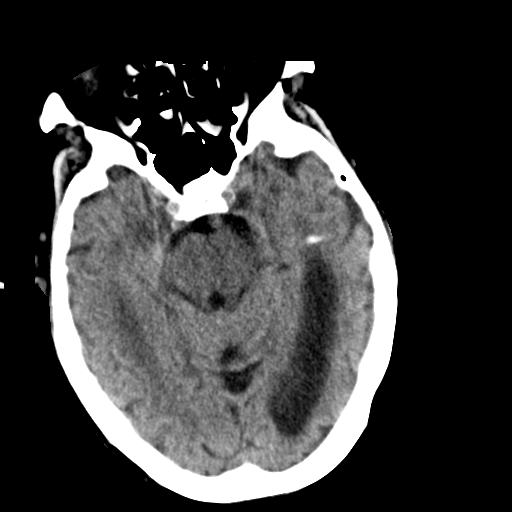
[im 13/36  brain]
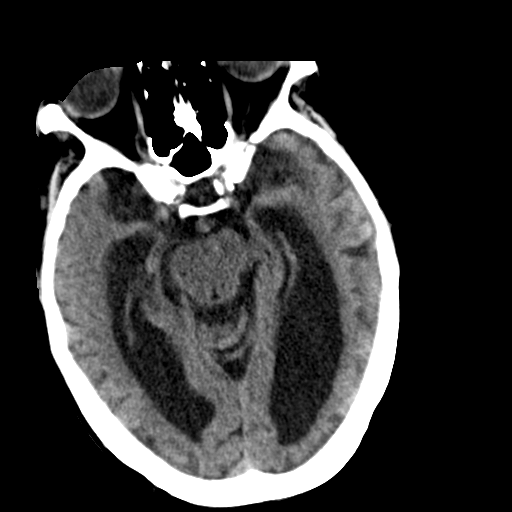
[im 13/36  bone]
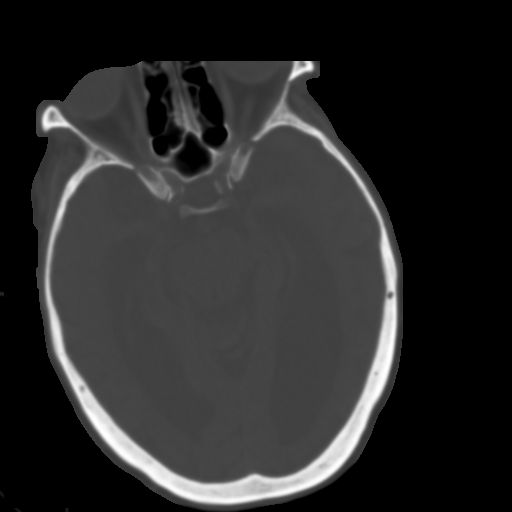
[im 16/36  brain]
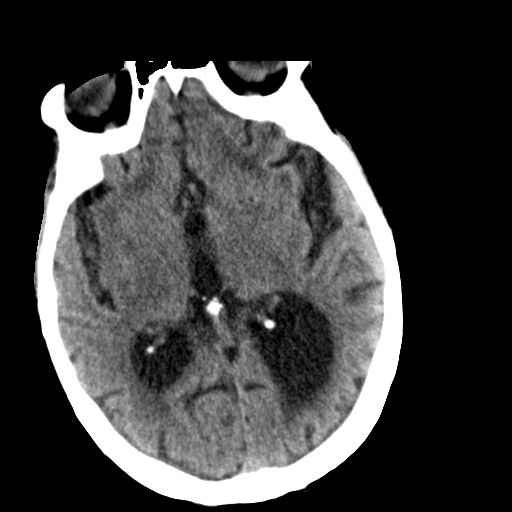
[im 18/36  brain]
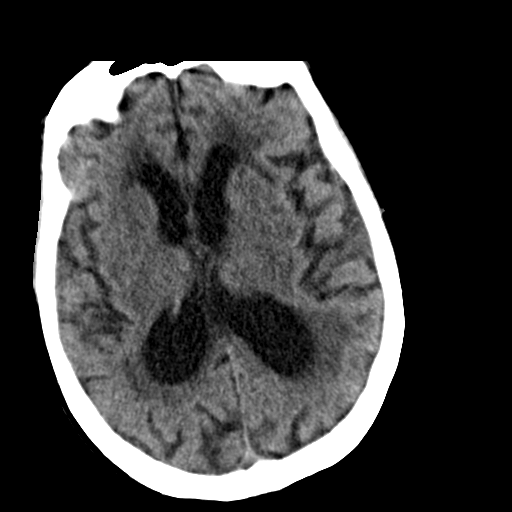
[im 21/36  brain]
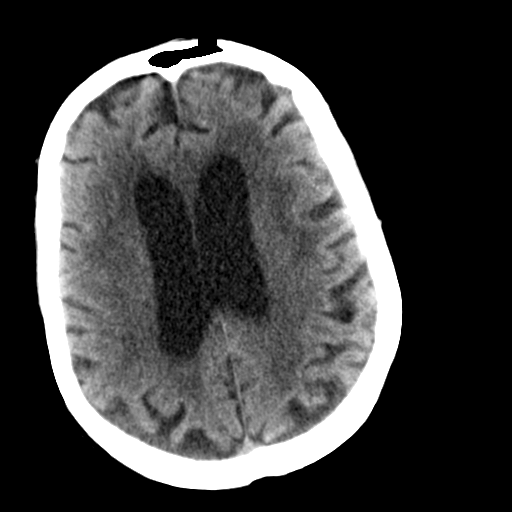
[im 23/36  brain]
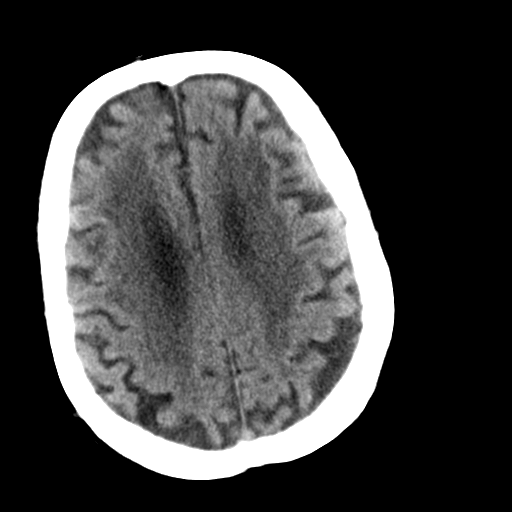
[im 23/36  bone]
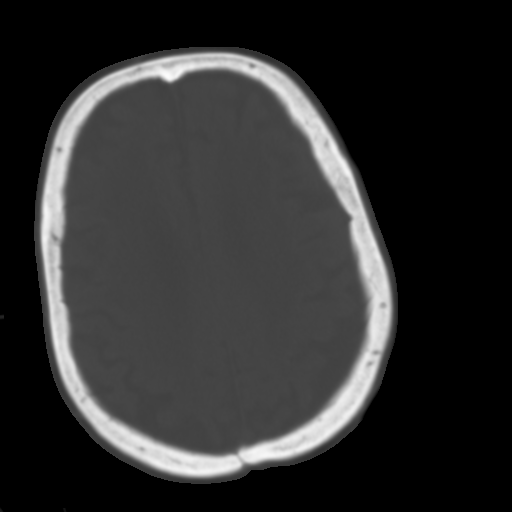
[im 26/36  brain]
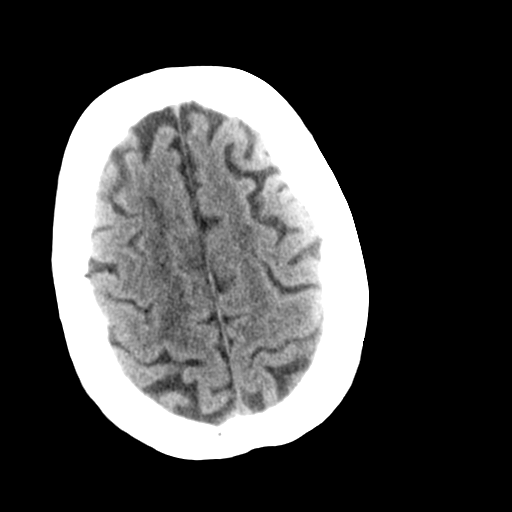
[im 28/36  brain]
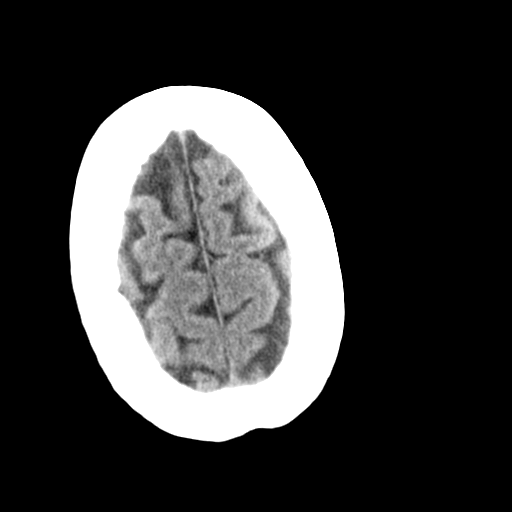
[im 31/36  brain]
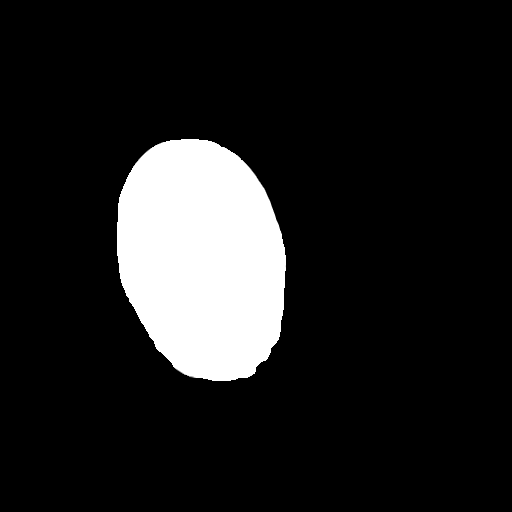
[im 33/36  brain]
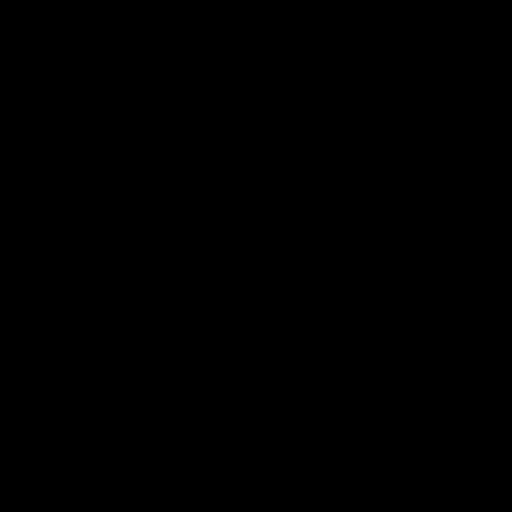
[im 33/36  bone]
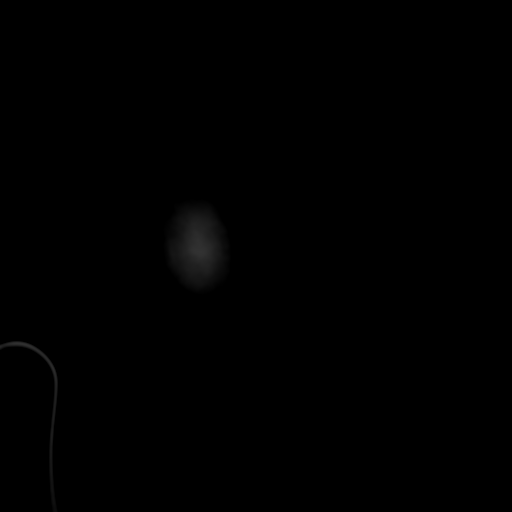

[Series 3: head bone · axial · 0.52mm/px · z∈[+510,+557]mm · 3 of 36 slices shown]
[im 3/36  bone]
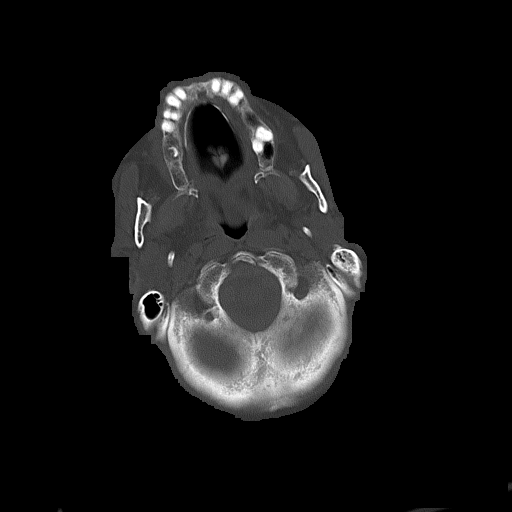
[im 8/36  bone]
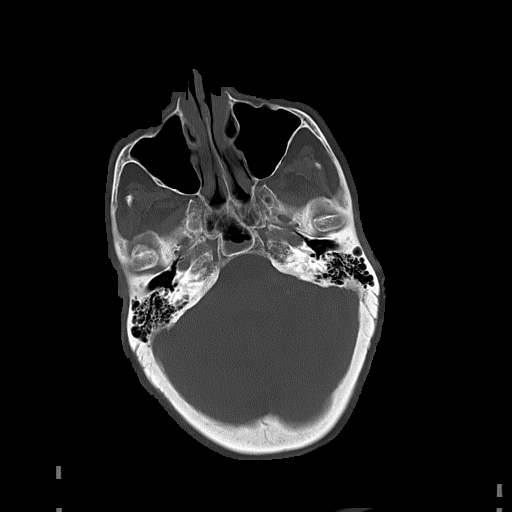
[im 13/36  bone]
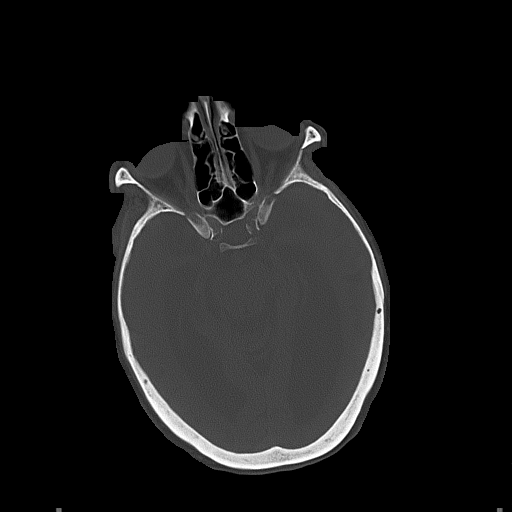

[16 of 30 positions shown; findings below may reference images not displayed]

FINDINGS: Moderate atrophy and chronic small-vessel white matter ischemic
changes again noted.

A remote right cerebellar infarct is present.

No acute intracranial abnormalities are identified, including mass
lesion or mass effect, hydrocephalus, extra-axial fluid collection,
midline shift, hemorrhage, or acute infarction.

The visualized bony calvarium is unremarkable.
IMPRESSION: No evidence of acute intracranial abnormality.

Atrophy, chronic small-vessel white matter ischemic changes and
remote right cerebellar infarct.

## 2016-11-30 IMAGING — CR DG CHEST 1V PORT
1 series · 1 of 1 positions shown · non-contrast
Comparison: None.

CLINICAL DATA: Altered mental status.  Emesis.  Fever

EXAM:
PORTABLE CHEST - 1 VIEW

[portable]
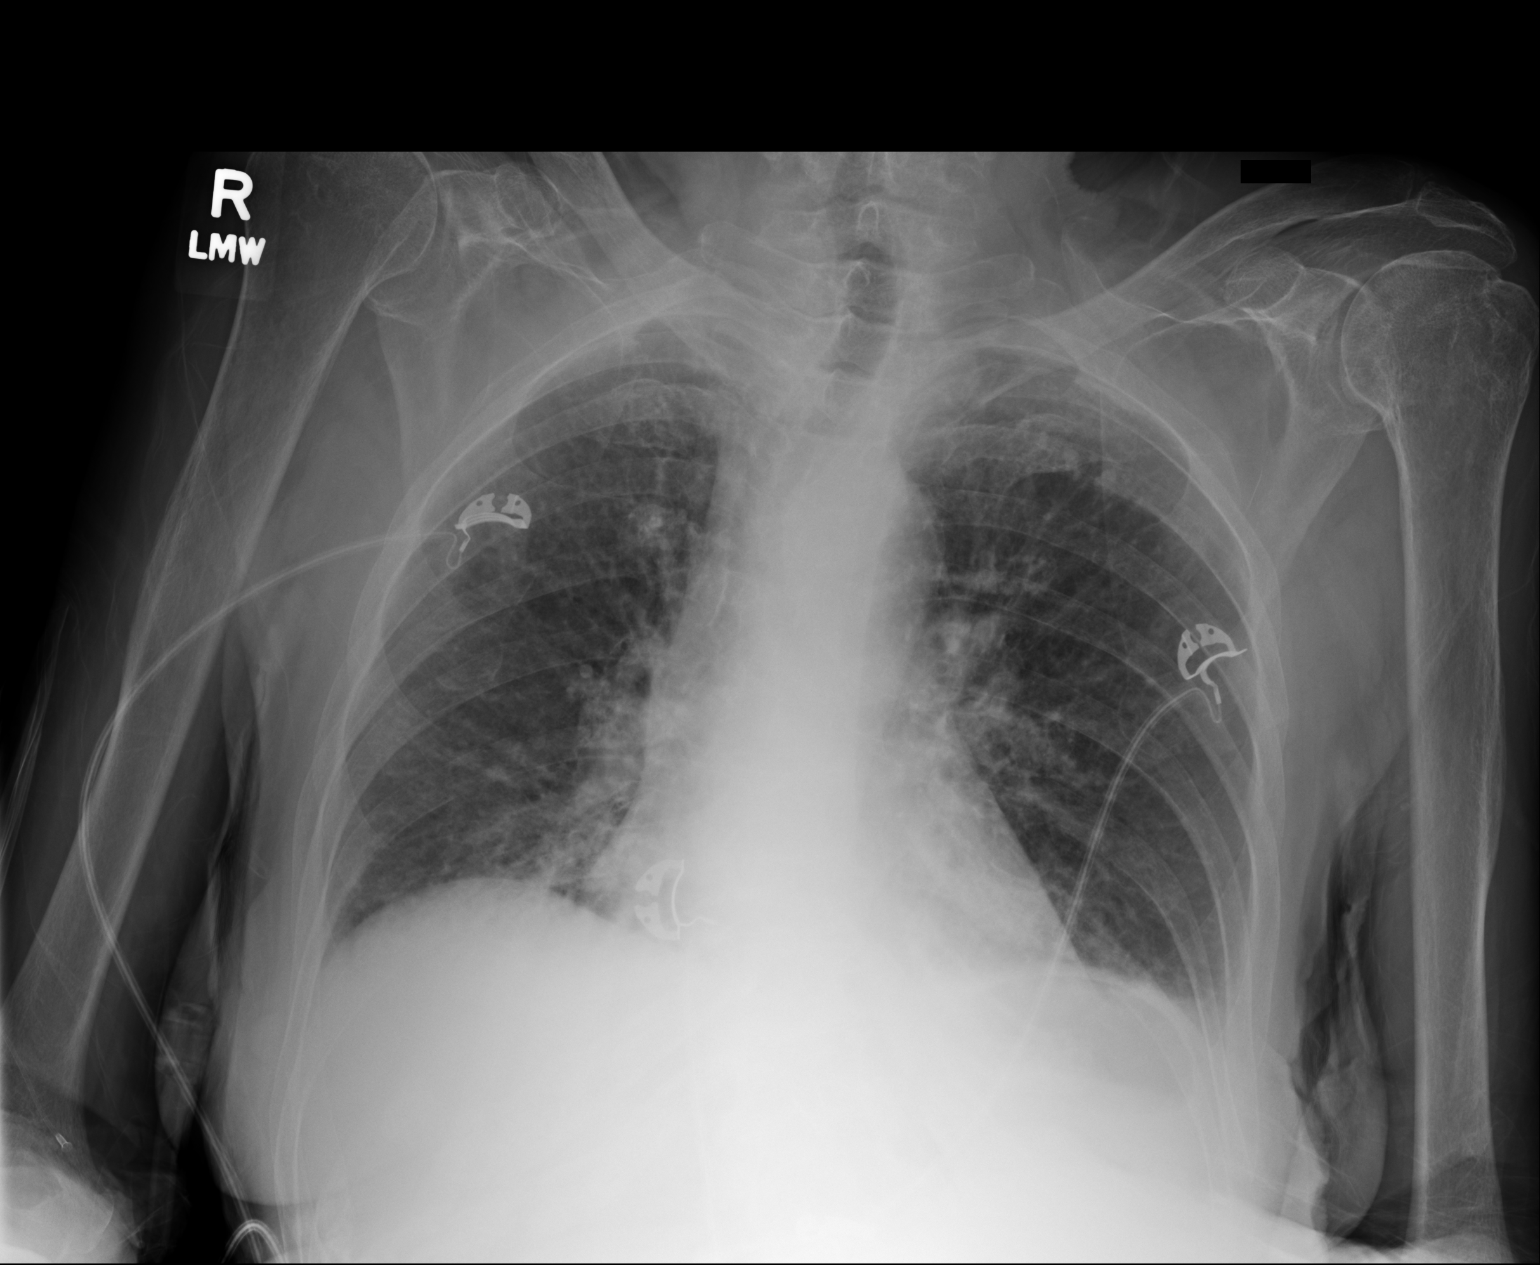

[1 of 1 positions shown; findings below may reference images not displayed]

FINDINGS: There is a shallow inspiration. There is mild chronic appearing
interstitial coarsening. No confluent airspace opacity is evident.
No effusion is evident. Heart size is normal.
IMPRESSION: Chronic appearing interstitial coarsening. No acute cardiopulmonary
findings.
